# Patient Record
Sex: Female | Born: 1965 | Race: White | Hispanic: No | Marital: Married | Smoking: Current some day smoker
Health system: Southern US, Community
[De-identification: ages and names within clinical notes are randomized; demographics above are authoritative.]

## PROBLEM LIST (undated history)

## (undated) DIAGNOSIS — I219 Acute myocardial infarction, unspecified: Secondary | ICD-10-CM

## (undated) DIAGNOSIS — I10 Essential (primary) hypertension: Secondary | ICD-10-CM

## (undated) DIAGNOSIS — J449 Chronic obstructive pulmonary disease, unspecified: Secondary | ICD-10-CM

## (undated) DIAGNOSIS — I509 Heart failure, unspecified: Secondary | ICD-10-CM

## (undated) DIAGNOSIS — I82409 Acute embolism and thrombosis of unspecified deep veins of unspecified lower extremity: Secondary | ICD-10-CM

## (undated) HISTORY — PX: CARDIAC CATHETERIZATION: SHX172

---
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
## 2004-07-06 ENCOUNTER — Emergency Department: Payer: Self-pay | Admitting: Emergency Medicine

## 2008-08-27 ENCOUNTER — Emergency Department: Payer: Self-pay | Admitting: Emergency Medicine

## 2008-09-22 ENCOUNTER — Emergency Department: Payer: Self-pay | Admitting: Emergency Medicine

## 2008-10-01 DIAGNOSIS — I82409 Acute embolism and thrombosis of unspecified deep veins of unspecified lower extremity: Secondary | ICD-10-CM

## 2008-10-01 HISTORY — DX: Acute embolism and thrombosis of unspecified deep veins of unspecified lower extremity: I82.409

## 2008-12-14 ENCOUNTER — Inpatient Hospital Stay: Payer: Self-pay | Admitting: Internal Medicine

## 2010-10-24 DIAGNOSIS — F32A Depression, unspecified: Secondary | ICD-10-CM | POA: Insufficient documentation

## 2011-04-09 DIAGNOSIS — J449 Chronic obstructive pulmonary disease, unspecified: Secondary | ICD-10-CM | POA: Insufficient documentation

## 2011-04-09 DIAGNOSIS — I219 Acute myocardial infarction, unspecified: Secondary | ICD-10-CM | POA: Insufficient documentation

## 2011-04-09 DIAGNOSIS — R7301 Impaired fasting glucose: Secondary | ICD-10-CM | POA: Insufficient documentation

## 2011-04-09 DIAGNOSIS — I998 Other disorder of circulatory system: Secondary | ICD-10-CM | POA: Insufficient documentation

## 2011-04-09 DIAGNOSIS — I10 Essential (primary) hypertension: Secondary | ICD-10-CM | POA: Insufficient documentation

## 2011-04-09 DIAGNOSIS — I82419 Acute embolism and thrombosis of unspecified femoral vein: Secondary | ICD-10-CM | POA: Insufficient documentation

## 2011-04-09 DIAGNOSIS — E785 Hyperlipidemia, unspecified: Secondary | ICD-10-CM | POA: Insufficient documentation

## 2011-06-21 DIAGNOSIS — G4733 Obstructive sleep apnea (adult) (pediatric): Secondary | ICD-10-CM | POA: Insufficient documentation

## 2011-07-18 DIAGNOSIS — Z7901 Long term (current) use of anticoagulants: Secondary | ICD-10-CM | POA: Insufficient documentation

## 2011-09-04 ENCOUNTER — Inpatient Hospital Stay: Payer: Self-pay | Admitting: Internal Medicine

## 2011-09-19 DIAGNOSIS — N938 Other specified abnormal uterine and vaginal bleeding: Secondary | ICD-10-CM | POA: Insufficient documentation

## 2011-10-29 ENCOUNTER — Ambulatory Visit: Payer: Self-pay | Admitting: Internal Medicine

## 2012-01-11 DIAGNOSIS — G932 Benign intracranial hypertension: Secondary | ICD-10-CM | POA: Insufficient documentation

## 2012-03-10 DIAGNOSIS — M94261 Chondromalacia, right knee: Secondary | ICD-10-CM | POA: Insufficient documentation

## 2012-03-14 ENCOUNTER — Emergency Department: Payer: Self-pay | Admitting: Emergency Medicine

## 2012-03-20 DIAGNOSIS — M23203 Derangement of unspecified medial meniscus due to old tear or injury, right knee: Secondary | ICD-10-CM | POA: Insufficient documentation

## 2012-12-08 DIAGNOSIS — I10 Essential (primary) hypertension: Secondary | ICD-10-CM | POA: Insufficient documentation

## 2012-12-08 DIAGNOSIS — Z720 Tobacco use: Secondary | ICD-10-CM | POA: Insufficient documentation

## 2013-01-29 DIAGNOSIS — M1711 Unilateral primary osteoarthritis, right knee: Secondary | ICD-10-CM | POA: Insufficient documentation

## 2013-04-21 DIAGNOSIS — Z6841 Body Mass Index (BMI) 40.0 and over, adult: Secondary | ICD-10-CM | POA: Insufficient documentation

## 2013-07-06 DIAGNOSIS — E559 Vitamin D deficiency, unspecified: Secondary | ICD-10-CM | POA: Insufficient documentation

## 2013-07-11 DIAGNOSIS — N912 Amenorrhea, unspecified: Secondary | ICD-10-CM | POA: Insufficient documentation

## 2013-07-11 DIAGNOSIS — N6019 Diffuse cystic mastopathy of unspecified breast: Secondary | ICD-10-CM | POA: Insufficient documentation

## 2014-02-24 DIAGNOSIS — R2681 Unsteadiness on feet: Secondary | ICD-10-CM | POA: Insufficient documentation

## 2014-04-25 ENCOUNTER — Emergency Department: Payer: Self-pay | Admitting: Emergency Medicine

## 2014-05-21 DIAGNOSIS — G249 Dystonia, unspecified: Secondary | ICD-10-CM | POA: Insufficient documentation

## 2014-05-21 DIAGNOSIS — M5020 Other cervical disc displacement, unspecified cervical region: Secondary | ICD-10-CM | POA: Insufficient documentation

## 2014-12-31 DIAGNOSIS — M47816 Spondylosis without myelopathy or radiculopathy, lumbar region: Secondary | ICD-10-CM | POA: Insufficient documentation

## 2015-09-07 DIAGNOSIS — Z8742 Personal history of other diseases of the female genital tract: Secondary | ICD-10-CM | POA: Insufficient documentation

## 2016-04-18 DIAGNOSIS — M5126 Other intervertebral disc displacement, lumbar region: Secondary | ICD-10-CM | POA: Insufficient documentation

## 2016-04-18 DIAGNOSIS — M5432 Sciatica, left side: Secondary | ICD-10-CM | POA: Insufficient documentation

## 2016-11-28 DIAGNOSIS — M1710 Unilateral primary osteoarthritis, unspecified knee: Secondary | ICD-10-CM | POA: Insufficient documentation

## 2017-07-10 ENCOUNTER — Emergency Department
Admission: EM | Admit: 2017-07-10 | Discharge: 2017-07-10 | Disposition: A | Discharge status: Home / Self Care | Payer: 59 | Attending: Emergency Medicine | Admitting: Emergency Medicine

## 2017-07-10 ENCOUNTER — Emergency Department: Payer: 59

## 2017-07-10 DIAGNOSIS — R51 Headache: Secondary | ICD-10-CM | POA: Diagnosis present

## 2017-07-10 DIAGNOSIS — J449 Chronic obstructive pulmonary disease, unspecified: Secondary | ICD-10-CM | POA: Diagnosis not present

## 2017-07-10 DIAGNOSIS — I10 Essential (primary) hypertension: Secondary | ICD-10-CM | POA: Diagnosis not present

## 2017-07-10 DIAGNOSIS — R519 Headache, unspecified: Secondary | ICD-10-CM

## 2017-07-10 HISTORY — DX: Chronic obstructive pulmonary disease, unspecified: J44.9

## 2017-07-10 HISTORY — DX: Essential (primary) hypertension: I10

## 2017-07-10 MED ORDER — BUTALBITAL-APAP-CAFFEINE 50-325-40 MG PO TABS
2.0000 | ORAL_TABLET | Freq: Once | ORAL | Status: AC
  Administered 2017-07-10: 2 via ORAL
  Filled 2017-07-10: qty 2

## 2017-07-10 MED ORDER — BUTALBITAL-APAP-CAFFEINE 50-325-40 MG PO TABS: 1.0000 | ORAL_TABLET | Freq: Four times a day (QID) | ORAL | 0 refills | Status: DC | PRN

## 2017-07-10 NOTE — ED Notes (Signed)
Pt presents with right sided head pain. Pt states she slept all day yesterday and that she feels "like my brain is coming out." She points to the top left side of her head. She states that it hurts when she moves or when it is touched. Pt pulled away when this nurse attempted to palpate scalp. Pt takes coumadin; nad noted.

## 2017-07-10 NOTE — ED Triage Notes (Signed)
Pt states she has a knot on her head left side, just came up yesterday. Pt states she has not had any trauma to the head.

## 2017-07-10 NOTE — ED Notes (Signed)

## 2017-07-10 NOTE — ED Provider Notes (Signed)
[facility] Emergency Department Provider Note       Time seen: ----------------------------------------- 10:33 AM on 07/10/2017 -----------------------------------------     I have reviewed the triage vital signs and the nursing notes.   HISTORY   Chief Complaint Headache    HPI Mary Lutz is a 51 y.o. female with a history of COPD and hypertension who presents to the ED for headache and dizziness. Patient states she had not on the left side of her head that came up yesterday. She does not remember any trauma to the head. pain is 9 out of 10 in the left side of her head, nothing makes it better or worse. patient states she had some blurry vision in her left eye that is now resolved. She does not have a history of migraines.  Past Medical History:  Diagnosis Date  . COPD (chronic obstructive pulmonary disease) ([place])   . Hypertension     There are no active problems to display for this patient.   History reviewed. No pertinent surgical history.  Allergies Morphine and related; Toradol [ketorolac tromethamine]; and Influenza vaccines  Social History Social History  Substance Use Topics  . Smoking status: Never Smoker  . Smokeless tobacco: Never Used  . Alcohol use No    Review of Systems Constitutional: Negative for fever. Eyes: positive for transient blurry vision in the left eye ENT:  Negative for congestion, sore throat Cardiovascular: Negative for chest pain. Respiratory: Negative for shortness of breath. Gastrointestinal: Negative for abdominal pain, vomiting and diarrhea. Genitourinary: Negative for dysuria. Musculoskeletal: Negative for back pain. Skin: positive for feelings of swelling on the left scalp Neurological: positive for headache  All systems negative/normal/unremarkable except as stated in the HPI  ____________________________________________   PHYSICAL EXAM:  VITAL SIGNS: ED Triage Vitals  Enc Vitals Group      BP 07/10/17 0824 130/84     Pulse Rate 07/10/17 0822 79     Resp 07/10/17 0822 20     Temp 07/10/17 0822 (!) 97.5 F (36.4 C)     Temp src --      SpO2 07/10/17 0822 98 %     Weight 07/10/17 0823 (!) 320 lb (145.2 kg)     Height 07/10/17 0823 5\' 7"  (1.702 m)     Head Circumference --      Peak Flow --      Pain Score 07/10/17 0822 9     Pain Loc --      Pain Edu? --      Excl. in [place]? --     Constitutional: Alert and oriented. Well appearing and in no distress. Eyes: Conjunctivae are normal. Normal extraocular movements. ENT   Head: Normocephalic and atraumatic.I do not palpate any abnormal swelling or lesions on the scalp   Nose: No congestion/rhinnorhea.   Mouth/Throat: Mucous membranes are moist.   Neck: No stridor. Cardiovascular: Normal rate, regular rhythm. No murmurs, rubs, or gallops. Respiratory: Normal respiratory effort without tachypnea nor retractions. Breath sounds are clear and equal bilaterally. No wheezes/rales/rhonchi. Gastrointestinal: Soft and nontender. Normal bowel sounds Musculoskeletal: Nontender with normal range of motion in extremities. No lower extremity tenderness nor edema. Neurologic:  Normal speech and language. No gross focal neurologic deficits are appreciated. strength, sensation, cranial nerves are normal Skin:  left parietal scalp is tender to touch but there is no swelling Psychiatric: Mood and affect are normal. Speech and behavior are normal.  ____________________________________________  ED COURSE:  Pertinent labs & imaging results  that were available during my care of the patient were reviewed by me and considered in my medical decision making (see chart for details). Patient presents for headache, we will assess with labs and imaging as indicated.   Procedures ____________________________________________   RADIOLOGY Images were viewed by me  CT head is  unremarkable ____________________________________________  DIFFERENTIAL DIAGNOSIS   hematoma, migraine, tension headache, sebaceous cyst   FINAL ASSESSMENT AND PLAN  headache   Plan: Patient had presented for headache. Patients imaging was negative for any acute process intracranially or extracranially. I will prescribe Fioricet she can try for headaches as needed. She is stable for outpatient follow-up with her doctor.   Earleen Newport, MD   Note: This note was generated in part or whole with voice recognition software. Voice recognition is usually quite accurate but there are transcription errors that can and very often do occur. I apologize for any typographical errors that were not detected and corrected.     Earleen Newport, MD 07/10/17 [phone]

## 2018-02-18 ENCOUNTER — Emergency Department: Payer: 59

## 2018-02-18 ENCOUNTER — Other Ambulatory Visit: Payer: Self-pay

## 2018-02-18 ENCOUNTER — Encounter: Payer: Self-pay | Admitting: Emergency Medicine

## 2018-02-18 ENCOUNTER — Emergency Department
Admission: EM | Admit: 2018-02-18 | Discharge: 2018-02-18 | Disposition: A | Discharge status: Home / Self Care | Payer: 59 | Attending: Student in an Organized Health Care Education/Training Program | Admitting: Student in an Organized Health Care Education/Training Program

## 2018-02-18 DIAGNOSIS — Y92017 Garden or yard in single-family (private) house as the place of occurrence of the external cause: Secondary | ICD-10-CM | POA: Insufficient documentation

## 2018-02-18 DIAGNOSIS — Z7901 Long term (current) use of anticoagulants: Secondary | ICD-10-CM | POA: Diagnosis not present

## 2018-02-18 DIAGNOSIS — J449 Chronic obstructive pulmonary disease, unspecified: Secondary | ICD-10-CM | POA: Insufficient documentation

## 2018-02-18 DIAGNOSIS — I10 Essential (primary) hypertension: Secondary | ICD-10-CM | POA: Insufficient documentation

## 2018-02-18 DIAGNOSIS — Z862 Personal history of diseases of the blood and blood-forming organs and certain disorders involving the immune mechanism: Secondary | ICD-10-CM | POA: Diagnosis not present

## 2018-02-18 DIAGNOSIS — W010XXA Fall on same level from slipping, tripping and stumbling without subsequent striking against object, initial encounter: Secondary | ICD-10-CM | POA: Diagnosis not present

## 2018-02-18 DIAGNOSIS — S301XXA Contusion of abdominal wall, initial encounter: Secondary | ICD-10-CM | POA: Insufficient documentation

## 2018-02-18 DIAGNOSIS — M79605 Pain in left leg: Secondary | ICD-10-CM | POA: Insufficient documentation

## 2018-02-18 DIAGNOSIS — Z79899 Other long term (current) drug therapy: Secondary | ICD-10-CM | POA: Insufficient documentation

## 2018-02-18 DIAGNOSIS — Y999 Unspecified external cause status: Secondary | ICD-10-CM | POA: Diagnosis not present

## 2018-02-18 DIAGNOSIS — Y9301 Activity, walking, marching and hiking: Secondary | ICD-10-CM | POA: Insufficient documentation

## 2018-02-18 DIAGNOSIS — S0990XA Unspecified injury of head, initial encounter: Secondary | ICD-10-CM | POA: Diagnosis present

## 2018-02-18 DIAGNOSIS — W19XXXA Unspecified fall, initial encounter: Secondary | ICD-10-CM

## 2018-02-18 HISTORY — DX: Heart failure, unspecified: I50.9

## 2018-02-18 LAB — CBC WITH DIFFERENTIAL/PLATELET
BASOS PCT: 0 %
Basophils Absolute: 0.1 10*3/uL (ref 0–0.1)
Eosinophils Absolute: 0.3 10*3/uL (ref 0–0.7)
Eosinophils Relative: 2 %
HEMATOCRIT: 45.5 % (ref 35.0–47.0)
HEMOGLOBIN: 15.8 g/dL (ref 12.0–16.0)
Lymphocytes Relative: 27 %
Lymphs Abs: 3.4 10*3/uL (ref 1.0–3.6)
MCH: 31.7 pg (ref 26.0–34.0)
MCHC: 34.6 g/dL (ref 32.0–36.0)
MCV: 91.7 fL (ref 80.0–100.0)
MONOS PCT: 10 %
Monocytes Absolute: 1.3 10*3/uL — ABNORMAL HIGH (ref 0.2–0.9)
NEUTROS ABS: 7.6 10*3/uL — AB (ref 1.4–6.5)
NEUTROS PCT: 61 %
Platelets: 321 10*3/uL (ref 150–440)
RBC: 4.96 MIL/uL (ref 3.80–5.20)
RDW: 14.2 % (ref 11.5–14.5)
WBC: 12.7 10*3/uL — AB (ref 3.6–11.0)

## 2018-02-18 LAB — COMPREHENSIVE METABOLIC PANEL
ALBUMIN: 4.1 g/dL (ref 3.5–5.0)
ALK PHOS: 63 U/L (ref 38–126)
ALT: 15 U/L (ref 14–54)
AST: 23 U/L (ref 15–41)
Anion gap: 9 (ref 5–15)
BUN: 13 mg/dL (ref 6–20)
CALCIUM: 8.9 mg/dL (ref 8.9–10.3)
CHLORIDE: 107 mmol/L (ref 101–111)
CO2: 23 mmol/L (ref 22–32)
CREATININE: 0.55 mg/dL (ref 0.44–1.00)
GFR calc non Af Amer: >60 mL/min (ref >60)
GLUCOSE: 96 mg/dL (ref 65–99)
Potassium: 4.1 mmol/L (ref 3.5–5.1)
SODIUM: 139 mmol/L (ref 135–145)
Total Bilirubin: 0.8 mg/dL (ref 0.3–1.2)
Total Protein: 7 g/dL (ref 6.5–8.1)

## 2018-02-18 MED ORDER — FENTANYL CITRATE (PF) 100 MCG/2ML IJ SOLN
50.0000 ug | INTRAMUSCULAR | Status: DC | PRN
  Administered 2018-02-18: 50 ug via INTRAVENOUS
  Filled 2018-02-18: qty 2

## 2018-02-18 MED ORDER — HYDROCODONE-ACETAMINOPHEN 5-325 MG PO TABS
1.0000 | ORAL_TABLET | Freq: Once | ORAL | Status: AC
  Administered 2018-02-18: 1 via ORAL
  Filled 2018-02-18: qty 1

## 2018-02-18 MED ORDER — TRAMADOL HCL 50 MG PO TABS: 50.0000 mg | ORAL_TABLET | Freq: Four times a day (QID) | ORAL | 0 refills | Status: DC | PRN

## 2018-02-18 MED ORDER — IOPAMIDOL (ISOVUE-300) INJECTION 61%
100.0000 mL | Freq: Once | INTRAVENOUS | Status: AC | PRN
  Administered 2018-02-18: 100 mL via INTRAVENOUS

## 2018-02-18 MED ORDER — LIDOCAINE 5 % EX PTCH: 1.0000 | MEDICATED_PATCH | Freq: Two times a day (BID) | CUTANEOUS | 0 refills | Status: AC

## 2018-02-18 NOTE — Discharge Instructions (Signed)

## 2018-02-18 NOTE — ED Notes (Signed)
Patient transported to X-ray 

## 2018-02-18 NOTE — ED Notes (Signed)
Pt went to X-Ray.  

## 2018-02-18 NOTE — ED Triage Notes (Signed)
Pt in via [facility] from home, reports mechanical fall in yard.  Pt reports left hip, leg, knee pain, pt with decreased ROM due to pain, no shortening/rotation noted.  Pt received 140mcg Fentanyl IM via EMS prior to arrival.  NAD noted a this time.

## 2018-02-18 NOTE — ED Notes (Signed)
Pt returned from X Ray.

## 2018-02-18 NOTE — ED Notes (Signed)
Pt went to CT

## 2018-02-18 NOTE — ED Provider Notes (Signed)
[facility] Emergency Department Provider Note    First MD Initiated Contact with Patient 02/18/18 1846     (approximate)  I have reviewed the triage vital signs and the nursing notes.   HISTORY  Chief Complaint Fall    HPI Mary Lutz is a 52 y.o. female presents the ER after mechanical fall.  On the yard helping her father get around when she fell landing on her left hip and hitting her left knee and rolling into a small hole.  Reportedly did hit her head.  She is on Coumadin for history of clotting disorder.  Denies any LOC.  No numbness or tingling.  States the pain is mild to moderate in severity.  Pain is worsened with movement.  Denies any chest pain or shortness of breath.  Past Medical History:  Diagnosis Date  . COPD (chronic obstructive pulmonary disease) ([place])   . Hypertension    No family history on file. History reviewed. No pertinent surgical history. There are no active problems to display for this patient.     Prior to Admission medications   Medication Sig Start Date End Date Taking? Authorizing Provider  medroxyPROGESTERone (PROVERA) 10 MG tablet Take 10 mg by mouth daily.  02/05/18  Yes [provider]  warfarin (COUMADIN) 5 MG tablet Take 1 tablet (5MG ) by mouth Monday, Wednesday and Friday and 1 tablets (7.5MG ) by mouth Tuesday, Thursday, Saturday and Sunday 01/08/18  Yes [provider]    Allergies Influenza vaccines; Morphine and related; and Toradol [ketorolac tromethamine]    Social History Social History   Tobacco Use  . Smoking status: Never Smoker  . Smokeless tobacco: Never Used  Substance Use Topics  . Alcohol use: No  . Drug use: No    Review of Systems Patient denies headaches, rhinorrhea, blurry vision, numbness, shortness of breath, chest pain, edema, cough, abdominal pain, nausea, vomiting, diarrhea, dysuria, fevers, rashes or hallucinations unless otherwise stated above in  HPI. ____________________________________________   PHYSICAL EXAM:  VITAL SIGNS: Vitals:   02/18/18 1848  BP: (!) 169/102  Pulse: 87  Resp: 20  Temp: (!) 97.4 F (36.3 C)  SpO2: 98%    Constitutional: Alert and oriented.  in no acute distress. Eyes: Conjunctivae are normal.  Head: Atraumatic. Nose: No congestion/rhinnorhea. Mouth/Throat: Mucous membranes are moist.   Neck: No stridor. Painless ROM.  Cardiovascular: Normal rate, regular rhythm. Grossly normal heart sounds.  Good peripheral circulation. Respiratory: Normal respiratory effort.  No retractions. Lungs CTAB. Gastrointestinal: Soft and nontender. No distention. No abdominal bruits. No CVA tenderness. Genitourinary:  Musculoskeletal:pain with palpation of left hip, left log roll, and knee, no obvious contusion, ecchymosis, or deformity.  Brisk cap refill distally.  No joint effusions. Neurologic:  Normal speech and language. No gross focal neurologic deficits are appreciated. No facial droop Skin:  Skin is warm, dry and intact. No rash noted. Psychiatric: Mood and affect are normal. Speech and behavior are normal.  ____________________________________________   LABS (all labs ordered are listed, but only abnormal results are displayed)  Results for orders placed or performed during the hospital encounter of 02/18/18 (from the past 24 hour(s))  CBC with Differential/Platelet     Status: Abnormal   Collection Time: 02/18/18  7:27 PM  Result Value Ref Range   WBC 12.7 (H) 3.6 - 11.0 K/uL   RBC 4.96 3.80 - 5.20 MIL/uL   Hemoglobin 15.8 12.0 - 16.0 g/dL   HCT 45.5 35.0 - 47.0 %  MCV 91.7 80.0 - 100.0 fL   MCH 31.7 26.0 - 34.0 pg   MCHC 34.6 32.0 - 36.0 g/dL   RDW 14.2 11.5 - 14.5 %   Platelets 321 150 - 440 K/uL   Neutrophils Relative % 61 %   Neutro Abs 7.6 (H) 1.4 - 6.5 K/uL   Lymphocytes Relative 27 %   Lymphs Abs 3.4 1.0 - 3.6 K/uL   Monocytes Relative 10 %   Monocytes Absolute 1.3 (H) 0.2 - 0.9 K/uL    Eosinophils Relative 2 %   Eosinophils Absolute 0.3 0 - 0.7 K/uL   Basophils Relative 0 %   Basophils Absolute 0.1 0 - 0.1 K/uL   ____________________________________________  ____________________________________________  RADIOLOGY  I personally reviewed all radiographic images ordered to evaluate for the above acute complaints and reviewed radiology reports and findings.  These findings were personally discussed with the patient.  Please see medical record for radiology report.  ____________________________________________   PROCEDURES  Procedure(s) performed:  Procedures    Critical Care performed: no ____________________________________________   INITIAL IMPRESSION / ASSESSMENT AND PLAN / ED COURSE  Pertinent labs & imaging results that were available during my care of the patient were reviewed by me and considered in my medical decision making (see chart for details).  DDX: sah, sdh, edh, fracture, contusion, soft tissue injury, viscous injury, concussion, hemorrhage   Mary Lutz is a 52 y.o. who presents to the ED with injuries and pain as described above.  Radiographs ordered to evaluate for fracture show no dislocation but possible diastases of the pelvis and based on her pain will give IV pain medication and reassess.  Clinical Course as of Feb 19 2308  Tue Feb 18, 2018  2025 Patient reassessed and based on persistent pain will order CT imaging as I am concern for hematoma or occult fracture.   [PR]  2307 CT imaging shows no evidence of acute trauma or fracture.  Pelvic diastases noted on radiograph likely chronic.  Does likely have some abdominal wall contusion which would correlate the patient's pain.  Patient feels that the pain is improving and starting to move her legs.  This point I do believe patient stable and appropriate for discharge home.  Have discussed with the patient and available family all diagnostics and treatments performed thus far and all  questions were answered to the best of my ability. The patient demonstrates understanding and agreement with plan.    [PR]    Clinical Course User Index [PR] Merlyn Lot, MD     As part of my medical decision making, I reviewed the following data within the [place] notes reviewed and incorporated, Labs reviewed, notes from prior ED visits and [place] Controlled Substance Database   ____________________________________________   FINAL CLINICAL IMPRESSION(S) / ED DIAGNOSES  Final diagnoses:  Left leg pain  Fall, initial encounter  Contusion of abdominal wall, initial encounter      NEW MEDICATIONS STARTED DURING THIS VISIT:  New Prescriptions   No medications on file     Note:  This document was prepared using Dragon voice recognition software and may include unintentional dictation errors.    Merlyn Lot, MD 02/18/18 [phone]

## 2018-03-06 DIAGNOSIS — M1612 Unilateral primary osteoarthritis, left hip: Secondary | ICD-10-CM | POA: Insufficient documentation

## 2018-03-06 DIAGNOSIS — M47812 Spondylosis without myelopathy or radiculopathy, cervical region: Secondary | ICD-10-CM | POA: Insufficient documentation

## 2018-03-06 DIAGNOSIS — M1712 Unilateral primary osteoarthritis, left knee: Secondary | ICD-10-CM | POA: Insufficient documentation

## 2018-07-07 ENCOUNTER — Emergency Department: Payer: 59

## 2018-07-07 ENCOUNTER — Encounter: Payer: Self-pay | Admitting: Emergency Medicine

## 2018-07-07 ENCOUNTER — Other Ambulatory Visit: Payer: Self-pay

## 2018-07-07 ENCOUNTER — Emergency Department
Admission: EM | Admit: 2018-07-07 | Discharge: 2018-07-07 | Disposition: A | Discharge status: Home / Self Care | Payer: 59 | Attending: Student in an Organized Health Care Education/Training Program | Admitting: Student in an Organized Health Care Education/Training Program

## 2018-07-07 DIAGNOSIS — Y929 Unspecified place or not applicable: Secondary | ICD-10-CM | POA: Diagnosis not present

## 2018-07-07 DIAGNOSIS — Z7901 Long term (current) use of anticoagulants: Secondary | ICD-10-CM | POA: Diagnosis not present

## 2018-07-07 DIAGNOSIS — I509 Heart failure, unspecified: Secondary | ICD-10-CM | POA: Insufficient documentation

## 2018-07-07 DIAGNOSIS — Y999 Unspecified external cause status: Secondary | ICD-10-CM | POA: Diagnosis not present

## 2018-07-07 DIAGNOSIS — W01198A Fall on same level from slipping, tripping and stumbling with subsequent striking against other object, initial encounter: Secondary | ICD-10-CM | POA: Diagnosis not present

## 2018-07-07 DIAGNOSIS — M25561 Pain in right knee: Secondary | ICD-10-CM | POA: Insufficient documentation

## 2018-07-07 DIAGNOSIS — Z79899 Other long term (current) drug therapy: Secondary | ICD-10-CM | POA: Diagnosis not present

## 2018-07-07 DIAGNOSIS — S0990XA Unspecified injury of head, initial encounter: Secondary | ICD-10-CM | POA: Insufficient documentation

## 2018-07-07 DIAGNOSIS — Y9389 Activity, other specified: Secondary | ICD-10-CM | POA: Diagnosis not present

## 2018-07-07 DIAGNOSIS — I110 Hypertensive heart disease with heart failure: Secondary | ICD-10-CM | POA: Insufficient documentation

## 2018-07-07 LAB — BASIC METABOLIC PANEL
Anion gap: 11 (ref 5–15)
BUN: 12 mg/dL (ref 6–20)
CHLORIDE: 107 mmol/L (ref 98–111)
CO2: 22 mmol/L (ref 22–32)
CREATININE: 0.58 mg/dL (ref 0.44–1.00)
Calcium: 9 mg/dL (ref 8.9–10.3)
GFR calc Af Amer: >60 mL/min (ref >60)
GFR calc non Af Amer: >60 mL/min (ref >60)
GLUCOSE: 95 mg/dL (ref 70–99)
Potassium: 3.8 mmol/L (ref 3.5–5.1)
Sodium: 140 mmol/L (ref 135–145)

## 2018-07-07 LAB — CBC
HEMATOCRIT: 47.2 % — AB (ref 35.0–47.0)
Hemoglobin: 16.1 g/dL — ABNORMAL HIGH (ref 12.0–16.0)
MCH: 32.2 pg (ref 26.0–34.0)
MCHC: 34 g/dL (ref 32.0–36.0)
MCV: 94.7 fL (ref 80.0–100.0)
Platelets: 315 10*3/uL (ref 150–440)
RBC: 4.99 MIL/uL (ref 3.80–5.20)
RDW: 14.7 % — AB (ref 11.5–14.5)
WBC: 9.2 10*3/uL (ref 3.6–11.0)

## 2018-07-07 LAB — PROTIME-INR
INR: 2.02
Prothrombin Time: 22.7 seconds — ABNORMAL HIGH (ref 11.4–15.2)

## 2018-07-07 MED ORDER — TRAMADOL HCL 50 MG PO TABS: 50.0000 mg | ORAL_TABLET | Freq: Four times a day (QID) | ORAL | 0 refills | Status: DC | PRN

## 2018-07-07 MED ORDER — BUTALBITAL-APAP-CAFFEINE 50-325-40 MG PO TABS
2.0000 | ORAL_TABLET | Freq: Once | ORAL | Status: AC
  Administered 2018-07-07: 2 via ORAL
  Filled 2018-07-07: qty 2

## 2018-07-07 NOTE — ED Triage Notes (Signed)
Says she fell just prior to coming in after tripping on nail with her flip flops.  Says she is unsure about loc.  She has abrasions on right forehead and right cheek.  She is on coumadin.  Says she just feels tired.

## 2018-07-07 NOTE — ED Notes (Signed)
Hematoma noted above pt's right eye. Abrasion to face. Pt states R wrist and R knee pain.

## 2018-07-07 NOTE — ED Provider Notes (Signed)
[facility] Emergency Department Provider Note    First MD Initiated Contact with Patient 07/07/18 1305     (approximate)  I have reviewed the triage vital signs and the nursing notes.   HISTORY  Chief Complaint Fall and Head Injury    HPI Mary Lutz is a 52 y.o. female history of CHF and COPD on Coumadin for history of DVT PE presents the ER after mechanical fall hitting her right forehead right cheek right knee.  Describes pain is mild.  Presented to the ER as she is on Coumadin to be evaluated after head injury.  Denies any chest pain or shortness of breath.    Past Medical History:  Diagnosis Date  . CHF (congestive heart failure) ([place])   . COPD (chronic obstructive pulmonary disease) ([place])   . Hypertension    No family history on file. History reviewed. No pertinent surgical history. There are no active problems to display for this patient.     Prior to Admission medications   Medication Sig Start Date End Date Taking? Authorizing Provider  lidocaine (LIDODERM) 5 % Place 1 patch onto the skin every 12 (twelve) hours. Remove & Discard patch within 12 hours or as directed by MD 02/18/18 02/18/19  Merlyn Lot, MD  medroxyPROGESTERone (PROVERA) 10 MG tablet Take 10 mg by mouth daily.  02/05/18   [provider]  traMADol (ULTRAM) 50 MG tablet Take 1 tablet (50 mg total) by mouth every 6 (six) hours as needed. 07/07/18 07/07/19  Merlyn Lot, MD  warfarin (COUMADIN) 5 MG tablet Take 1 tablet (5MG ) by mouth Monday, Wednesday and Friday and 1 tablets (7.5MG ) by mouth Tuesday, Thursday, Saturday and Sunday 01/08/18   [provider]    Allergies Influenza vaccines; Morphine and related; Pneumococcal polysaccharide vaccine; and Toradol [ketorolac tromethamine]    Social History Social History   Tobacco Use  . Smoking status: Never Smoker  . Smokeless tobacco: Never Used  Substance Use Topics  . Alcohol use: No  .  Drug use: No    Review of Systems Patient denies headaches, rhinorrhea, blurry vision, numbness, shortness of breath, chest pain, edema, cough, abdominal pain, nausea, vomiting, diarrhea, dysuria, fevers, rashes or hallucinations unless otherwise stated above in HPI. ____________________________________________   PHYSICAL EXAM:  VITAL SIGNS: Vitals:   07/07/18 1309 07/07/18 1330  BP:  (!) 154/90  Pulse: 75 62  Resp: (!) 23 (!) 22  Temp:    SpO2: 97% 96%    Constitutional: Alert and oriented.  Eyes: Conjunctivae are normal.  Head: Contusion abrasion to right forehead and cheek.  No proptosis.  Dr. ocular motions are intact.  Midface is stable. Nose: No congestion/rhinnorhea. Mouth/Throat: Mucous membranes are moist.   Neck: No stridor. Painless ROM.  Cardiovascular: Normal rate, regular rhythm. Grossly normal heart sounds.  Good peripheral circulation. Respiratory: Normal respiratory effort.  No retractions. Lungs CTAB. Gastrointestinal: Soft and nontender. No distention. No abdominal bruits. No CVA tenderness. Genitourinary:  Musculoskeletal: Pain ecchymoses the right knee and small effusion.  No deformity.   No pain to BUE.   No joint effusions. Neurologic:  Normal speech and language. No gross focal neurologic deficits are appreciated. No facial droop Skin:  Skin is warm, dry and intact. No rash noted. Psychiatric: Mood and affect are normal. Speech and behavior are normal.  ____________________________________________   LABS (all labs ordered are listed, but only abnormal results are displayed)  Results for orders placed or performed during the hospital encounter  of 07/07/18 (from the past 24 hour(s))  Basic metabolic panel     Status: None   Collection Time: 07/07/18 11:22 AM  Result Value Ref Range   Sodium 140 135 - 145 mmol/L   Potassium 3.8 3.5 - 5.1 mmol/L   Chloride 107 98 - 111 mmol/L   CO2 22 22 - 32 mmol/L   Glucose, Bld 95 70 - 99 mg/dL   BUN 12 6 - 20  mg/dL   Creatinine, Ser 0.58 0.44 - 1.00 mg/dL   Calcium 9.0 8.9 - 10.3 mg/dL   GFR calc non Af Amer >60 >60 mL/min   GFR calc Af Amer >60 >60 mL/min   Anion gap 11 5 - 15  CBC     Status: Abnormal   Collection Time: 07/07/18 11:22 AM  Result Value Ref Range   WBC 9.2 3.6 - 11.0 K/uL   RBC 4.99 3.80 - 5.20 MIL/uL   Hemoglobin 16.1 (H) 12.0 - 16.0 g/dL   HCT 47.2 (H) 35.0 - 47.0 %   MCV 94.7 80.0 - 100.0 fL   MCH 32.2 26.0 - 34.0 pg   MCHC 34.0 32.0 - 36.0 g/dL   RDW 14.7 (H) 11.5 - 14.5 %   Platelets 315 150 - 440 K/uL  Protime-INR     Status: Abnormal   Collection Time: 07/07/18 11:22 AM  Result Value Ref Range   Prothrombin Time 22.7 (H) 11.4 - 15.2 seconds   INR 2.02    ____________________________________________ ____________________________________________  RADIOLOGY  I personally reviewed all radiographic images ordered to evaluate for the above acute complaints and reviewed radiology reports and findings.  These findings were personally discussed with the patient.  Please see medical record for radiology report.  ____________________________________________   PROCEDURES  Procedure(s) performed:  Procedures    Critical Care performed: no ____________________________________________   INITIAL IMPRESSION / ASSESSMENT AND PLAN / ED COURSE  Pertinent labs & imaging results that were available during my care of the patient were reviewed by me and considered in my medical decision making (see chart for details).   DDX: sdh, sah, iph, fracture, contusion,   Mary Lutz is a 52 y.o. who presents to the ED with symptoms as described above.  Blood work is reassuring.  CT imaging ordered for the above differential shows no evidence of acute intracranial abnormality.  Knee film shows no evidence of fracture.  Will provide pain medication.  Clinical Course as of Jul 07 1540  Mon Jul 07, 2018  1446 Discussed results with patient.  We will plan ambulation trial.    [PR]  1540 Patient with pain of the right knee with ambulation therefore will place a knee immobilizer give crutches and have patient follow-up with orthopedics.   [PR]    Clinical Course User Index [PR] Merlyn Lot, MD     As part of my medical decision making, I reviewed the following data within the [place] notes reviewed and incorporated, Labs reviewed, notes from prior ED visits and [place] Controlled Substance Database   ____________________________________________   FINAL CLINICAL IMPRESSION(S) / ED DIAGNOSES  Final diagnoses:  Minor head injury, initial encounter  Acute pain of right knee      NEW MEDICATIONS STARTED DURING THIS VISIT:  Current Discharge Medication List       Note:  This document was prepared using Dragon voice recognition software and may include unintentional dictation errors.    Merlyn Lot, MD 07/07/18 [phone]

## 2018-11-12 ENCOUNTER — Emergency Department: Payer: 59

## 2018-11-12 ENCOUNTER — Emergency Department
Admission: EM | Admit: 2018-11-12 | Discharge: 2018-11-12 | Disposition: A | Discharge status: Home / Self Care | Payer: 59 | Attending: Emergency Medicine | Admitting: Emergency Medicine

## 2018-11-12 ENCOUNTER — Other Ambulatory Visit: Payer: Self-pay

## 2018-11-12 ENCOUNTER — Encounter: Payer: Self-pay | Admitting: Emergency Medicine

## 2018-11-12 DIAGNOSIS — R51 Headache: Secondary | ICD-10-CM | POA: Diagnosis present

## 2018-11-12 DIAGNOSIS — I110 Hypertensive heart disease with heart failure: Secondary | ICD-10-CM | POA: Insufficient documentation

## 2018-11-12 DIAGNOSIS — Z79899 Other long term (current) drug therapy: Secondary | ICD-10-CM | POA: Insufficient documentation

## 2018-11-12 DIAGNOSIS — I509 Heart failure, unspecified: Secondary | ICD-10-CM | POA: Insufficient documentation

## 2018-11-12 DIAGNOSIS — Z7901 Long term (current) use of anticoagulants: Secondary | ICD-10-CM | POA: Insufficient documentation

## 2018-11-12 DIAGNOSIS — J449 Chronic obstructive pulmonary disease, unspecified: Secondary | ICD-10-CM | POA: Diagnosis not present

## 2018-11-12 DIAGNOSIS — R519 Headache, unspecified: Secondary | ICD-10-CM

## 2018-11-12 LAB — CBC WITH DIFFERENTIAL/PLATELET
Abs Immature Granulocytes: 0.02 10*3/uL (ref 0.00–0.07)
Basophils Absolute: 0.1 10*3/uL (ref 0.0–0.1)
Basophils Relative: 1 %
EOS ABS: 0.2 10*3/uL (ref 0.0–0.5)
Eosinophils Relative: 2 %
HEMATOCRIT: 47.1 % — AB (ref 36.0–46.0)
Hemoglobin: 15.4 g/dL — ABNORMAL HIGH (ref 12.0–15.0)
Immature Granulocytes: 0 %
Lymphocytes Relative: 27 %
Lymphs Abs: 3 10*3/uL (ref 0.7–4.0)
MCH: 31.4 pg (ref 26.0–34.0)
MCHC: 32.7 g/dL (ref 30.0–36.0)
MCV: 95.9 fL (ref 80.0–100.0)
Monocytes Absolute: 1.1 10*3/uL — ABNORMAL HIGH (ref 0.1–1.0)
Monocytes Relative: 10 %
NEUTROS PCT: 60 %
Neutro Abs: 6.6 10*3/uL (ref 1.7–7.7)
Platelets: 323 10*3/uL (ref 150–400)
RBC: 4.91 MIL/uL (ref 3.87–5.11)
RDW: 13.3 % (ref 11.5–15.5)
WBC: 10.9 10*3/uL — ABNORMAL HIGH (ref 4.0–10.5)
nRBC: 0 % (ref 0.0–0.2)

## 2018-11-12 LAB — COMPREHENSIVE METABOLIC PANEL
ALBUMIN: 4.2 g/dL (ref 3.5–5.0)
ALT: 15 U/L (ref 0–44)
ANION GAP: 7 (ref 5–15)
AST: 18 U/L (ref 15–41)
Alkaline Phosphatase: 59 U/L (ref 38–126)
BUN: 12 mg/dL (ref 6–20)
CO2: 24 mmol/L (ref 22–32)
Calcium: 9.2 mg/dL (ref 8.9–10.3)
Chloride: 108 mmol/L (ref 98–111)
Creatinine, Ser: 0.49 mg/dL (ref 0.44–1.00)
GFR calc non Af Amer: >60 mL/min (ref >60)
GLUCOSE: 83 mg/dL (ref 70–99)
Potassium: 4.1 mmol/L (ref 3.5–5.1)
SODIUM: 139 mmol/L (ref 135–145)
Total Bilirubin: 0.7 mg/dL (ref 0.3–1.2)
Total Protein: 7.6 g/dL (ref 6.5–8.1)

## 2018-11-12 LAB — PROTIME-INR
INR: 2.36
Prothrombin Time: 25.5 seconds — ABNORMAL HIGH (ref 11.4–15.2)

## 2018-11-12 MED ORDER — ACETAZOLAMIDE ER 500 MG PO CP12: 500.0000 mg | ORAL_CAPSULE | Freq: Two times a day (BID) | ORAL | 0 refills | Status: DC

## 2018-11-12 MED ORDER — LORAZEPAM 2 MG/ML IJ SOLN
1.0000 mg | Freq: Once | INTRAMUSCULAR | Status: AC
  Administered 2018-11-12: 1 mg via INTRAVENOUS
  Filled 2018-11-12: qty 1

## 2018-11-12 MED ORDER — ONDANSETRON HCL 4 MG/2ML IJ SOLN
4.0000 mg | Freq: Once | INTRAMUSCULAR | Status: AC
  Administered 2018-11-12: 4 mg via INTRAVENOUS
  Filled 2018-11-12: qty 2

## 2018-11-12 MED ORDER — OXYCODONE-ACETAMINOPHEN 5-325 MG PO TABS: 1.0000 | ORAL_TABLET | ORAL | 0 refills | Status: DC | PRN

## 2018-11-12 MED ORDER — OXYCODONE-ACETAMINOPHEN 5-325 MG PO TABS: 1.0000 | ORAL_TABLET | Freq: Four times a day (QID) | ORAL | 0 refills | Status: DC | PRN

## 2018-11-12 MED ORDER — HYDROMORPHONE HCL 1 MG/ML IJ SOLN
0.5000 mg | Freq: Once | INTRAMUSCULAR | Status: AC
  Administered 2018-11-12: 0.5 mg via INTRAVENOUS
  Filled 2018-11-12: qty 1

## 2018-11-12 MED ORDER — IOHEXOL 350 MG/ML SOLN
75.0000 mL | Freq: Once | INTRAVENOUS | Status: AC | PRN
  Administered 2018-11-12: 75 mL via INTRAVENOUS

## 2018-11-12 MED ORDER — MECLIZINE HCL 25 MG PO TABS: 25.0000 mg | ORAL_TABLET | Freq: Three times a day (TID) | ORAL | 0 refills | Status: DC | PRN

## 2018-11-12 MED ORDER — FENTANYL CITRATE (PF) 100 MCG/2ML IJ SOLN
12.5000 ug | Freq: Once | INTRAMUSCULAR | Status: AC
  Administered 2018-11-12: 12.5 ug via INTRAVENOUS
  Filled 2018-11-12: qty 2

## 2018-11-12 NOTE — ED Provider Notes (Signed)
This patient was signed out to me by Dr. Corinna Capra.  53 year old female presenting for a headache.  She underwent evaluation for CVA with CT of the head which did not show any acute intracranial process and CT angiogram of the head and neck which showed normal vessels.  I have followed up her MRI, which does not show any signs of stroke and on my examination, the patient has no focal neurologic deficit.  There is some fluid around the optic nerve which may indicate signs of idiopathic intracranial hypertension.  I have reviewed this with the patient, who states she has been told this before.  She has been placed on Diamox in the past, but self discontinued this medication.  She does not currently follow with a neurologist.  At this time, the patient has no evidence of an acute neurologic emergency that requires admission or intervention.  We will plan to discharge the patient with Diamox, as well as Percocet.  She cannot take NSAID medications given her Coumadin use.  She has been instructed to follow-up with her PMD tomorrow, as previously scheduled, and to make an appointment with a neurologist.  Follow-up instructions as well as return precautions were discussed   Eula Listen, MD 11/12/18 2313

## 2018-11-12 NOTE — ED Provider Notes (Signed)
[facility] Emergency Department Provider Note   ____________________________________________   First MD Initiated Contact with Patient 11/12/18 1606     (approximate)  I have reviewed the triage vital signs and the nursing notes.   HISTORY  Chief Complaint Headache   HPI Kalisi L Molino is a 53 y.o. female who reports she had a headache yesterday she thought was involved with her ear infection she says she has been fighting and then she went to bed woke up this morning headache was still there later on in the day around about 27 is understood that she had sudden worsening of the headache with pain in the neck and the back of the neck back and vertex of the head with vertigo will come on at once.  The vertigo is worse with head movement.  Everything spins.  The headache is deep and makes her feel like the back of her head to blow off.   Past Medical History:  Diagnosis Date  . CHF (congestive heart failure) ([place])   . COPD (chronic obstructive pulmonary disease) ([place])   . Hypertension     There are no active problems to display for this patient.   History reviewed. No pertinent surgical history.  Prior to Admission medications   Medication Sig Start Date End Date Taking? Authorizing Provider  lidocaine (LIDODERM) 5 % Place 1 patch onto the skin every 12 (twelve) hours. Remove & Discard patch within 12 hours or as directed by MD 02/18/18 02/18/19  Merlyn Lot, MD  medroxyPROGESTERone (PROVERA) 10 MG tablet Take 10 mg by mouth daily.  02/05/18   [provider]  oxyCODONE-acetaminophen (PERCOCET) 5-325 MG tablet Take 1 tablet by mouth every 4 (four) hours as needed for severe pain. 11/12/18 11/12/19  Nena Polio, MD  traMADol (ULTRAM) 50 MG tablet Take 1 tablet (50 mg total) by mouth every 6 (six) hours as needed. 07/07/18 07/07/19  Merlyn Lot, MD  warfarin (COUMADIN) 5 MG tablet Take 1 tablet (5MG ) by mouth Monday, Wednesday and Friday  and 1 tablets (7.5MG ) by mouth Tuesday, Thursday, Saturday and Sunday 01/08/18   [provider]    Allergies Influenza vaccines; Morphine and related; Pneumococcal polysaccharide vaccine; and Toradol [ketorolac tromethamine]  No family history on file.  Social History Social History   Tobacco Use  . Smoking status: Never Smoker  . Smokeless tobacco: Never Used  Substance Use Topics  . Alcohol use: No  . Drug use: No    Review of Systems  Constitutional: No fever/chills Eyes: No visual changes. ENT: No sore throat. Cardiovascular: Denies chest pain. Respiratory: Denies shortness of breath. Gastrointestinal: No abdominal pain.  No nausea, no vomiting.  No diarrhea.  No constipation. Genitourinary: Negative for dysuria. Musculoskeletal: Negative for back pain. Skin: Negative for rash. Neurological: Negative for focal weakness   ____________________________________________   PHYSICAL EXAM:  VITAL SIGNS: ED Triage Vitals  Enc Vitals Group     BP 11/12/18 1410 (!) 125/94     Pulse Rate 11/12/18 1410 87     Resp 11/12/18 1410 18     Temp 11/12/18 1410 98.4 F (36.9 C)     Temp Source 11/12/18 1410 Oral     SpO2 11/12/18 1410 97 %     Weight 11/12/18 1411 260 lb (117.9 kg)     Height 11/12/18 1411 5\' 7"  (1.702 m)     Head Circumference --      Peak Flow --      Pain  Score 11/12/18 1411 10     Pain Loc --      Pain Edu? --      Excl. in [place]? --    Constitutional: Alert and oriented. Well appearing and in no acute distress. Eyes: Conjunctivae are normal. PERRL. EOMI. fundi appear to be intact Head: Atraumatic. Nose: No congestion/rhinnorhea. Mouth/Throat: Mucous membranes are moist.  Oropharynx non-erythematous. Neck: No stridor.   Cardiovascular: Normal rate, regular rhythm. Grossly normal heart sounds.  Good peripheral circulation. Respiratory: Normal respiratory effort.  No retractions. Lungs CTAB. Gastrointestinal: Soft and nontender. No distention.  No abdominal bruits. No CVA tenderness. Musculoskeletal: No lower extremity tenderness nor edema.  . Neurologic:  Normal speech and language. No gross focal neurologic deficits are appreciated.  Specifically cranial nerves II through XII are intact of the visual fields were not checked finger-to-nose rapid alternating movements and hands are normal motor strength is 5/5 throughout patient does not report any numbness I do not see a great deal of nystagmus present with head movement.  Patient does complain of a lot of dizziness though when I lay her back and move her head from one side to the other.  Thrust testing is i suspicious for intracranial pathology as I do not see any deviation. Skin:  Skin is warm, dry and intact. No rash noted. Psychiatric: Mood and affect are normal. Speech and behavior are normal.  ____________________________________________   LABS (all labs ordered are listed, but only abnormal results are displayed)  Labs Reviewed  PROTIME-INR - Abnormal; Notable for the following components:      Result Value   Prothrombin Time 25.5 (*)    All other components within normal limits  CBC WITH DIFFERENTIAL/PLATELET - Abnormal; Notable for the following components:   WBC 10.9 (*)    Hemoglobin 15.4 (*)    HCT 47.1 (*)    Monocytes Absolute 1.1 (*)    All other components within normal limits  COMPREHENSIVE METABOLIC PANEL   ____________________________________________  EKG   ____________________________________________  RADIOLOGY  ED MD interpretation: Initial CT of the head is negative.  I reviewed the films  Official radiology report(s): Ct Angio Head W Or Wo Contrast  Result Date: 11/12/2018 CLINICAL DATA:  Initial evaluation for acute severe headache. EXAM: CT ANGIOGRAPHY HEAD AND NECK TECHNIQUE: Multidetector CT imaging of the head and neck was performed using the standard protocol during bolus administration of intravenous contrast. Multiplanar CT image  reconstructions and MIPs were obtained to evaluate the vascular anatomy. Carotid stenosis measurements (when applicable) are obtained utilizing NASCET criteria, using the distal internal carotid diameter as the denominator. CONTRAST:  9mL OMNIPAQUE IOHEXOL 350 MG/ML SOLN COMPARISON:  Prior head CT from earlier the same day. FINDINGS: CTA NECK FINDINGS Aortic arch: Visualized aortic arch of normal caliber with normal 3 vessel morphology. Mild atheromatous plaque about the arch and origin of the great vessels without hemodynamically significant stenosis. Visualized subclavian arteries widely patent. Right carotid system: Right common carotid artery patent from its origin to the bifurcation without stenosis. Mild atheromatous change about the right bifurcation without significant stenosis. Multifocal irregularity involving the mid and distal right ICA, most consistent with changes related FMD. No significant stenosis or associated aneurysm. No dissection. Left carotid system: Left common carotid artery patent from its origin to the bifurcation without stenosis. Mild atheromatous change about the left bifurcation without significant narrowing. Similar but more mild changes involving the mid-distal left ICA suggestive of FMD. Again, no significant stenosis or associated aneurysm. No dissection.  Vertebral arteries: Vertebral arteries patent within the neck without stenosis, dissection, or occlusion. Skeleton: No acute osseous finding. No worrisome osseous lesions. Mild-to-moderate multilevel cervical spondylolysis, most notable at C6-7. Other neck: No other acute soft tissue abnormality within the neck. Upper chest: Small layering right pleural effusion partially visualized. Visualized upper chest demonstrates no other acute finding. Review of the MIP images confirms the above findings CTA HEAD FINDINGS Anterior circulation: Petrous segments widely patent bilaterally. Mild scattered atheromatous plaque within the carotid  siphons without significant stenosis. ICA termini well perfused. A1 segments patent bilaterally. Normal anterior communicating artery. Anterior cerebral arteries widely patent to their distal aspects. No M1 stenosis or occlusion. Normal MCA bifurcations. Distal MCA branches well perfused and symmetric. Posterior circulation: Vertebral arteries widely patent to the vertebrobasilar junction without stenosis. Left vertebral artery slightly dominant. Patent right PICA. Left PICA not visualized. Basilar widely patent to its distal aspect without stenosis. Superior cerebellar arteries patent bilaterally. Right PCA supplied via a hypoplastic right P1 segment as well as a small right posterior communicating artery. Left PCA supplied mainly via the basilar. Right PCA diffusely hypoplastic. PCAs well perfused to their distal aspects without stenosis. Venous sinuses: Patent. Anatomic variants: None significant. Delayed phase: No abnormal enhancement. Review of the MIP images confirms the above findings IMPRESSION: 1. Negative CTA for acute vascular abnormality. No large vessel occlusion, hemodynamically significant stenosis, or dissection. No intracranial aneurysm. 2. 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 3. Mild atherosclerotic change about the carotid bifurcations and carotid siphons for age. Electronically Signed   By: Jeannine Boga M.D.   On: 11/12/2018 18:41   Ct Head Wo Contrast  Result Date: 11/12/2018 CLINICAL DATA:  Headache, vertigo.  History of hypertension. EXAM: CT HEAD WITHOUT CONTRAST TECHNIQUE: Contiguous axial images were obtained from the base of the skull through the vertex without intravenous contrast. COMPARISON:  CT HEAD July 07, 2018 FINDINGS: BRAIN: No intraparenchymal hemorrhage, mass effect nor midline shift. The ventricles and sulci are normal. No acute large vascular territory infarcts. No abnormal  extra-axial fluid collections. Basal cisterns are patent. VASCULAR: Mild calcific atherosclerosis. SKULL/SOFT TISSUES: No skull fracture. Partially empty sella. Mild temporomandibular osteoarthrosis. No significant soft tissue swelling. ORBITS/SINUSES: The included ocular globes and orbital contents are normal.Trace paranasal sinus mucosal thickening. Mastoid air cells are well aerated. OTHER: None. IMPRESSION: 1. No acute intracranial process. 2. Mild atherosclerosis, advanced for age. Electronically Signed   By: Elon Alas M.D.   On: 11/12/2018 14:41   Ct Angio Neck W And/or Wo Contrast  Result Date: 11/12/2018 CLINICAL DATA:  Initial evaluation for acute severe headache. EXAM: CT ANGIOGRAPHY HEAD AND NECK TECHNIQUE: Multidetector CT imaging of the head and neck was performed using the standard protocol during bolus administration of intravenous contrast. Multiplanar CT image reconstructions and MIPs were obtained to evaluate the vascular anatomy. Carotid stenosis measurements (when applicable) are obtained utilizing NASCET criteria, using the distal internal carotid diameter as the denominator. CONTRAST:  12mL OMNIPAQUE IOHEXOL 350 MG/ML SOLN COMPARISON:  Prior head CT from earlier the same day. FINDINGS: CTA NECK FINDINGS Aortic arch: Visualized aortic arch of normal caliber with normal 3 vessel morphology. Mild atheromatous plaque about the arch and origin of the great vessels without hemodynamically significant stenosis. Visualized subclavian arteries widely patent. Right carotid system: Right common carotid artery patent from its origin to the bifurcation without stenosis. Mild atheromatous change about the right bifurcation without significant stenosis. Multifocal  irregularity involving the mid and distal right ICA, most consistent with changes related FMD. No significant stenosis or associated aneurysm. No dissection. Left carotid system: Left common carotid artery patent from its origin to the  bifurcation without stenosis. Mild atheromatous change about the left bifurcation without significant narrowing. Similar but more mild changes involving the mid-distal left ICA suggestive of FMD. Again, no significant stenosis or associated aneurysm. No dissection. Vertebral arteries: Vertebral arteries patent within the neck without stenosis, dissection, or occlusion. Skeleton: No acute osseous finding. No worrisome osseous lesions. Mild-to-moderate multilevel cervical spondylolysis, most notable at C6-7. Other neck: No other acute soft tissue abnormality within the neck. Upper chest: Small layering right pleural effusion partially visualized. Visualized upper chest demonstrates no other acute finding. Review of the MIP images confirms the above findings CTA HEAD FINDINGS Anterior circulation: Petrous segments widely patent bilaterally. Mild scattered atheromatous plaque within the carotid siphons without significant stenosis. ICA termini well perfused. A1 segments patent bilaterally. Normal anterior communicating artery. Anterior cerebral arteries widely patent to their distal aspects. No M1 stenosis or occlusion. Normal MCA bifurcations. Distal MCA branches well perfused and symmetric. Posterior circulation: Vertebral arteries widely patent to the vertebrobasilar junction without stenosis. Left vertebral artery slightly dominant. Patent right PICA. Left PICA not visualized. Basilar widely patent to its distal aspect without stenosis. Superior cerebellar arteries patent bilaterally. Right PCA supplied via a hypoplastic right P1 segment as well as a small right posterior communicating artery. Left PCA supplied mainly via the basilar. Right PCA diffusely hypoplastic. PCAs well perfused to their distal aspects without stenosis. Venous sinuses: Patent. Anatomic variants: None significant. Delayed phase: No abnormal enhancement. Review of the MIP images confirms the above findings IMPRESSION: 1. Negative CTA for acute  vascular abnormality. No large vessel occlusion, hemodynamically significant stenosis, or dissection. No intracranial aneurysm. 2. 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 3. Mild atherosclerotic change about the carotid bifurcations and carotid siphons for age. Electronically Signed   By: Jeannine Boga M.D.   On: 11/12/2018 18:41    ____________________________________________   PROCEDURES  Procedure(s) performed:  Procedures  Critical Care performed:   ____________________________________________   INITIAL IMPRESSION / ASSESSMENT AND PLAN / ED COURSE Patient had sudden onset of severe neck ache and back of a headache.  This could be due to a dissection.  We will get a CT angiogram of the head and neck to check for dissection of the neck.  If this is negative I anticipate we will get an MRI of the head.  This will help me determine if there is any kind of stroke going on that did not show up on the CT.  Patient says she says morphine makes her get short of breath but she can take Percocets.  I will try some fentanyl and very small amount and see if she can tolerate that.   ----------------------------------------- 8:37 PM on 11/12/2018 -----------------------------------------  CT Angie of the head and neck are normal.  She still has the vertigo.  I will try the MRI to see what that looks like.  If she is okay with that I will give her some Percocet to go home with.  Also give her some Antivert for the dizziness.      ____________________________________________   FINAL CLINICAL IMPRESSION(S) / ED DIAGNOSES  Final diagnoses:  Acute nonintractable headache, unspecified headache type     ED Discharge Orders  Ordered    oxyCODONE-acetaminophen (PERCOCET) 5-325 MG tablet  Every 4 hours PRN     11/12/18 2036           Note:  This document was prepared using Dragon voice  recognition software and may include unintentional dictation errors.    Nena Polio, MD 11/12/18 2038

## 2018-11-12 NOTE — Discharge Instructions (Addendum)
Please return for worse headache.  Also return for fever vomiting or feeling sicker.  Please follow-up with your regular doctor in the next few days.  You can take the Percocet 1 pill 4 times a day as needed for day or so.  Use the Antivert 1 pill 3 times a day as needed for dizziness.  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  Please read the insert about this, and start Diamox.  You may continue to take pain medication for your headache as well.  Do not drive within 8 hours of taking Percocet.  Make an appointment with Dr. Melrose Nakayama, the neurologist, for further evaluation.

## 2018-11-12 NOTE — ED Triage Notes (Signed)
PT arrives with complaints of headache that woke her from her sleep this morning. Pt states she feels like everything is spinning around her. In triage pt a & o x 4, speech clear, no weakness or neuro deficits noted in triage.

## 2018-11-12 NOTE — ED Notes (Signed)
Patient completed MRI screen with MRI tech.

## 2018-11-12 NOTE — ED Notes (Signed)
Patient back from MRI.

## 2018-12-22 ENCOUNTER — Emergency Department: Payer: 59

## 2018-12-22 ENCOUNTER — Other Ambulatory Visit: Payer: Self-pay

## 2018-12-22 ENCOUNTER — Emergency Department
Admission: EM | Admit: 2018-12-22 | Discharge: 2018-12-22 | Disposition: A | Discharge status: Home / Self Care | Payer: 59 | Attending: Emergency Medicine | Admitting: Emergency Medicine

## 2018-12-22 DIAGNOSIS — I110 Hypertensive heart disease with heart failure: Secondary | ICD-10-CM | POA: Diagnosis not present

## 2018-12-22 DIAGNOSIS — F17200 Nicotine dependence, unspecified, uncomplicated: Secondary | ICD-10-CM | POA: Diagnosis not present

## 2018-12-22 DIAGNOSIS — J069 Acute upper respiratory infection, unspecified: Secondary | ICD-10-CM | POA: Diagnosis not present

## 2018-12-22 DIAGNOSIS — R0602 Shortness of breath: Secondary | ICD-10-CM | POA: Diagnosis present

## 2018-12-22 DIAGNOSIS — I509 Heart failure, unspecified: Secondary | ICD-10-CM | POA: Diagnosis not present

## 2018-12-22 DIAGNOSIS — B9789 Other viral agents as the cause of diseases classified elsewhere: Secondary | ICD-10-CM

## 2018-12-22 DIAGNOSIS — Z79899 Other long term (current) drug therapy: Secondary | ICD-10-CM | POA: Diagnosis not present

## 2018-12-22 DIAGNOSIS — J441 Chronic obstructive pulmonary disease with (acute) exacerbation: Secondary | ICD-10-CM | POA: Diagnosis not present

## 2018-12-22 DIAGNOSIS — Z7901 Long term (current) use of anticoagulants: Secondary | ICD-10-CM | POA: Insufficient documentation

## 2018-12-22 LAB — BASIC METABOLIC PANEL
Anion gap: 9 (ref 5–15)
BUN: 16 mg/dL (ref 6–20)
CO2: 22 mmol/L (ref 22–32)
Calcium: 8.6 mg/dL — ABNORMAL LOW (ref 8.9–10.3)
Chloride: 107 mmol/L (ref 98–111)
Creatinine, Ser: 0.63 mg/dL (ref 0.44–1.00)
GFR calc Af Amer: >60 mL/min (ref >60)
GFR calc non Af Amer: >60 mL/min (ref >60)
Glucose, Bld: 136 mg/dL — ABNORMAL HIGH (ref 70–99)
Potassium: 4.1 mmol/L (ref 3.5–5.1)
Sodium: 138 mmol/L (ref 135–145)

## 2018-12-22 LAB — PROTIME-INR
INR: 2.2 — ABNORMAL HIGH (ref 0.8–1.2)
Prothrombin Time: 23.8 seconds — ABNORMAL HIGH (ref 11.4–15.2)

## 2018-12-22 LAB — CBC
HCT: 45.3 % (ref 36.0–46.0)
Hemoglobin: 15.1 g/dL — ABNORMAL HIGH (ref 12.0–15.0)
MCH: 31.9 pg (ref 26.0–34.0)
MCHC: 33.3 g/dL (ref 30.0–36.0)
MCV: 95.6 fL (ref 80.0–100.0)
Platelets: 385 10*3/uL (ref 150–400)
RBC: 4.74 MIL/uL (ref 3.87–5.11)
RDW: 13.3 % (ref 11.5–15.5)
WBC: 15.4 10*3/uL — ABNORMAL HIGH (ref 4.0–10.5)
nRBC: 0 % (ref 0.0–0.2)

## 2018-12-22 LAB — INFLUENZA PANEL BY PCR (TYPE A & B)
Influenza A By PCR: NEGATIVE
Influenza B By PCR: NEGATIVE

## 2018-12-22 MED ORDER — ALBUTEROL SULFATE HFA 108 (90 BASE) MCG/ACT IN AERS
1.0000 | INHALATION_SPRAY | Freq: Once | RESPIRATORY_TRACT | Status: AC
  Administered 2018-12-22: 2 via RESPIRATORY_TRACT
  Filled 2018-12-22: qty 6.7

## 2018-12-22 MED ORDER — SODIUM CHLORIDE 0.9 % IV BOLUS: 500.0000 mL | Freq: Once | INTRAVENOUS | Status: DC

## 2018-12-22 MED ORDER — IPRATROPIUM-ALBUTEROL 0.5-2.5 (3) MG/3ML IN SOLN: 3.0000 mL | Freq: Once | RESPIRATORY_TRACT | Status: DC

## 2018-12-22 MED ORDER — METHYLPREDNISOLONE 4 MG PO TBPK: ORAL_TABLET | ORAL | 0 refills | Status: DC

## 2018-12-22 MED ORDER — ALBUTEROL SULFATE HFA 108 (90 BASE) MCG/ACT IN AERS: 2.0000 | INHALATION_SPRAY | Freq: Once | RESPIRATORY_TRACT | Status: DC

## 2018-12-22 MED ORDER — ACETAMINOPHEN 500 MG PO TABS
1000.0000 mg | ORAL_TABLET | ORAL | Status: AC
  Administered 2018-12-22: 1000 mg via ORAL
  Filled 2018-12-22: qty 2

## 2018-12-22 NOTE — Discharge Instructions (Addendum)
We believe that your symptoms are caused today by an exacerbation of your COPD, and possibly bronchitis and less likely COVID-19 (though you will need to take precautions in case you have it.. see below).  Please take the prescribed medications and any medications that you have at home for your COPD.  Follow up with your doctor as recommended.  If you develop any new or worsening symptoms, including but not limited to fever, persistent vomiting, worsening shortness of breath, or other symptoms that concern you, please return to the Emergency Department immediately.     Person Under Monitoring Name: Mary Lutz  Location: [address] [place] [address]   Infection Prevention Recommendations for Individuals Confirmed to have, or Being Evaluated for, 2019 Novel Coronavirus (COVID-19) Infection Who Receive Care at Home  Individuals who are confirmed to have, or are being evaluated for, COVID-19 should follow the prevention steps below until a healthcare provider or local or state health department says they can return to normal activities.  Stay home except to get medical care You should restrict activities outside your home, except for getting medical care. Do not go to work, school, or public areas, and do not use public transportation or taxis.  Call ahead before visiting your doctor Before your medical appointment, call the healthcare provider and tell them that you have, or are being evaluated for, COVID-19 infection. This will help the healthcare providers office take steps to keep other people from getting infected. Ask your healthcare provider to call the local or state health department.  Monitor your symptoms Seek prompt medical attention if your illness is worsening (e.g., difficulty breathing). Before going to your medical appointment, call the healthcare provider and tell them that you have, or are being evaluated for, COVID-19 infection. Ask your healthcare provider to  call the local or state health department.  Wear a facemask You should wear a facemask that covers your nose and mouth when you are in the same room with other people and when you visit a healthcare provider. People who live with or visit you should also wear a facemask while they are in the same room with you.  Separate yourself from other people in your home As much as possible, you should stay in a different room from other people in your home. Also, you should use a separate bathroom, if available.  Avoid sharing household items You should not share dishes, drinking glasses, cups, eating utensils, towels, bedding, or other items with other people in your home. After using these items, you should wash them thoroughly with soap and water.  Cover your coughs and sneezes Cover your mouth and nose with a tissue when you cough or sneeze, or you can cough or sneeze into your sleeve. Throw used tissues in a lined trash can, and immediately wash your hands with soap and water for at least 20 seconds or use an alcohol-based hand rub.  Wash your [facility] your hands often and thoroughly with soap and water for at least 20 seconds. You can use an alcohol-based hand sanitizer if soap and water are not available and if your hands are not visibly dirty. Avoid touching your eyes, nose, and mouth with unwashed hands.   Prevention Steps for Caregivers and Household Members of Individuals Confirmed to have, or Being Evaluated for, COVID-19 Infection Being Cared for in the Home  If you live with, or provide care at home for, a person confirmed to have, or being evaluated for, COVID-19  infection please follow these guidelines to prevent infection:  Follow healthcare providers instructions Make sure that you understand and can help the patient follow any healthcare provider instructions for all care.  Provide for the patients basic needs You should help the patient with basic needs in the home  and provide support for getting groceries, prescriptions, and other personal needs.  Monitor the patients symptoms If they are getting sicker, call his or her medical provider and tell them that the patient has, or is being evaluated for, COVID-19 infection. This will help the healthcare providers office take steps to keep other people from getting infected. Ask the healthcare provider to call the local or state health department.  Limit the number of people who have contact with the patient If possible, have only one caregiver for the patient. Other household members should stay in another home or place of residence. If this is not possible, they should stay in another room, or be separated from the patient as much as possible. Use a separate bathroom, if available. Restrict visitors who do not have an essential need to be in the home.  Keep older adults, very young children, and other sick people away from the patient Keep older adults, very young children, and those who have compromised immune systems or chronic health conditions away from the patient. This includes people with chronic heart, lung, or kidney conditions, diabetes, and cancer.  Ensure good ventilation Make sure that shared spaces in the home have good air flow, such as from an air conditioner or an opened window, weather permitting.  Wash your hands often Wash your hands often and thoroughly with soap and water for at least 20 seconds. You can use an alcohol based hand sanitizer if soap and water are not available and if your hands are not visibly dirty. Avoid touching your eyes, nose, and mouth with unwashed hands. Use disposable paper towels to dry your hands. If not available, use dedicated cloth towels and replace them when they become wet.  Wear a facemask and gloves Wear a disposable facemask at all times in the room and gloves when you touch or have contact with the patients blood, body fluids, and/or secretions  or excretions, such as sweat, saliva, sputum, nasal mucus, vomit, urine, or feces.  Ensure the mask fits over your nose and mouth tightly, and do not touch it during use. Throw out disposable facemasks and gloves after using them. Do not reuse. Wash your hands immediately after removing your facemask and gloves. If your personal clothing becomes contaminated, carefully remove clothing and launder. Wash your hands after handling contaminated clothing. Place all used disposable facemasks, gloves, and other waste in a lined container before disposing them with other household waste. Remove gloves and wash your hands immediately after handling these items.  Do not share dishes, glasses, or other household items with the patient Avoid sharing household items. You should not share dishes, drinking glasses, cups, eating utensils, towels, bedding, or other items with a patient who is confirmed to have, or being evaluated for, COVID-19 infection. After the person uses these items, you should wash them thoroughly with soap and water.  Wash laundry thoroughly Immediately remove and wash clothes or bedding that have blood, body fluids, and/or secretions or excretions, such as sweat, saliva, sputum, nasal mucus, vomit, urine, or feces, on them. Wear gloves when handling laundry from the patient. Read and follow directions on labels of laundry or clothing items and detergent. In general, wash and dry  with the warmest temperatures recommended on the label.  Clean all areas the individual has used often Clean all touchable surfaces, such as counters, tabletops, doorknobs, bathroom fixtures, toilets, phones, keyboards, tablets, and bedside tables, every day. Also, clean any surfaces that may have blood, body fluids, and/or secretions or excretions on them. Wear gloves when cleaning surfaces the patient has come in contact with. Use a diluted bleach solution (e.g., dilute bleach with 1 part bleach and 10 parts  water) or a household disinfectant with a label that says EPA-registered for coronaviruses. To make a bleach solution at home, add 1 tablespoon of bleach to 1 quart (4 cups) of water. For a larger supply, add  cup of bleach to 1 gallon (16 cups) of water. Read labels of cleaning products and follow recommendations provided on product labels. Labels contain instructions for safe and effective use of the cleaning product including precautions you should take when applying the product, such as wearing gloves or eye protection and making sure you have good ventilation during use of the product. Remove gloves and wash hands immediately after cleaning.  Monitor yourself for signs and symptoms of illness Caregivers and household members are considered close contacts, should monitor their health, and will be asked to limit movement outside of the home to the extent possible. Follow the monitoring steps for close contacts listed on the symptom monitoring form.   ? If you have additional questions, contact your local health department or call the epidemiologist on call at [phone] (available 24/7). ? This guidance is subject to change. For the most up-to-date guidance from [facility], please refer to their website: [email or web address]

## 2018-12-22 NOTE — ED Triage Notes (Signed)
Pt arrives POV for SOB, and dry cough. Pt seen by PCP and given prednisone and Azithromycin and has been using home duoneb's w/o relief. Pt has hx of COPD. Reports low grade fevers. Denies any recent travel or exposure. Sent here for evaluation by PCP.

## 2018-12-22 NOTE — ED Provider Notes (Signed)
[facility] Emergency Department Provider Note  ____________________________________________   None    (approximate)  I have reviewed the triage vital signs and the nursing notes.   HISTORY  Chief Complaint Shortness of Breath and Cough    HPI Mary Lutz is a 53 y.o. female that presents to the emergency department for evaluation of dry cough, SOB, chest tightness for 3 days. Patient was started on prednisone on Thursday, Azithromycin on Friday. Patient has been using duoneb treatments every 3-4 hours today with little relief. She continues to feel SOB. No known coronavirus exposure or travel history.       Past Medical History:  Diagnosis Date  . CHF (congestive heart failure) ([place])   . COPD (chronic obstructive pulmonary disease) ([place])   . Hypertension     There are no active problems to display for this patient.   History reviewed. No pertinent surgical history.  Prior to Admission medications   Medication Sig Start Date End Date Taking? Authorizing Provider  acetaZOLAMIDE (DIAMOX SEQUELS) 500 MG capsule Take 1 capsule (500 mg total) by mouth 2 (two) times daily. 11/12/18   Eula Listen, MD  lidocaine (LIDODERM) 5 % Place 1 patch onto the skin every 12 (twelve) hours. Remove & Discard patch within 12 hours or as directed by MD 02/18/18 02/18/19  Merlyn Lot, MD  meclizine (ANTIVERT) 25 MG tablet Take 1 tablet (25 mg total) by mouth 3 (three) times daily as needed for dizziness. 11/12/18   Nena Polio, MD  medroxyPROGESTERone (PROVERA) 10 MG tablet Take 10 mg by mouth daily.  02/05/18   [provider]  oxyCODONE-acetaminophen (PERCOCET) 5-325 MG tablet Take 1 tablet by mouth every 4 (four) hours as needed for severe pain. 11/12/18 11/12/19  Nena Polio, MD  oxyCODONE-acetaminophen (PERCOCET) 5-325 MG tablet Take 1 tablet by mouth every 6 (six) hours as needed for severe pain. 11/12/18 11/12/19  Eula Listen, MD   traMADol (ULTRAM) 50 MG tablet Take 1 tablet (50 mg total) by mouth every 6 (six) hours as needed. 07/07/18 07/07/19  Merlyn Lot, MD  warfarin (COUMADIN) 5 MG tablet Take 1 tablet (5MG ) by mouth Monday, Wednesday and Friday and 1 tablets (7.5MG ) by mouth Tuesday, Thursday, Saturday and Sunday 01/08/18   [provider]    Allergies Influenza vaccines; Morphine and related; Pneumococcal polysaccharide vaccine; and Toradol [ketorolac tromethamine]  History reviewed. No pertinent family history.  Social History Social History   Tobacco Use  . Smoking status: Current Some Day Smoker  . Smokeless tobacco: Never Used  Substance Use Topics  . Alcohol use: No  . Drug use: No    Review of Systems Constitutional: Positive for chills. ENT: Positive for rhinorrhea. Cardiovascular: No chest pain. Respiratory: Positive for cough, wheezing, SOB. Musculoskeletal: Negative for neck pain nor stiffness. Integumentary: Negative for rash. Neurological: No focal weakness nor numbness.   ____________________________________________   PHYSICAL EXAM:  VITAL SIGNS: ED Triage Vitals  Enc Vitals Group     BP 12/22/18 1624 (!) 155/125     Pulse Rate 12/22/18 1624 (!) 124     Resp 12/22/18 1624 20     Temp 12/22/18 1624 98.8 F (37.1 C)     Temp Source 12/22/18 1624 Oral     SpO2 12/22/18 1624 94 %     Weight 12/22/18 1628 270 lb (122.5 kg)     Height 12/22/18 1628 5\' 7"  (1.702 m)     Head Circumference --  Peak Flow --      Pain Score 12/22/18 1627 7     Pain Loc --      Pain Edu? --      Excl. in [place]? --     Constitutional: Alert and oriented. Generally well appearing and in no acute distress. Eyes: Conjunctivae are normal.  Nose: No congestion Neck: No stridor.   Cardiovascular: Grossly normal heart sounds. Respiratory: Mildly increased work of breathing. Scattered wheezes.  Musculoskeletal: No lower extremity tenderness nor edema. No gross deformities of  extremities. Neurologic:  Normal speech and language. No gross focal neurologic deficits are appreciated.  Skin:  Skin is warm, dry and intact. No rash noted.   ____________________________________________   LABS (all labs ordered are listed, but only abnormal results are displayed)  Labs Reviewed - No data to display  ____________________________________________   RADIOLOGY   Official radiology report(s): No results found.  ____________________________________________    INITIAL IMPRESSION / MDM / ASSESSMENT AND PLAN / ED COURSE      Patient presented to the emergency department for evaluation of SOB, CP for 3 days.  Patient has already been started on antibiotics and steroids.  She does have some wheezing and some mildly increased work of breathing.  She will be further evaluated in the emergency department.  ____________________________________________  FINAL CLINICAL IMPRESSION(S) / ED DIAGNOSES  Final diagnoses:  None        Note:  This document was prepared using Dragon voice recognition software and may include unintentional dictation errors.   Laban Emperor, PA-C 12/22/18 Claybon Jabs, MD 12/22/18 2224

## 2018-12-22 NOTE — ED Notes (Signed)
Pt walked around the room with pulse ox staying 95%-100% and HR staying from 98-105.

## 2018-12-22 NOTE — ED Provider Notes (Signed)
[facility] Emergency Department Provider Note   ____________________________________________   First MD Initiated Contact with Patient 12/22/18 1724     (approximate)  I have reviewed the triage vital signs and the nursing notes.   HISTORY  Chief Complaint Shortness of Breath and Cough    HPI Mary Lutz is a 53 y.o. female is a history of COPD congestive heart failure and previous blood clots  Patient reports that she is been having fevers cough chills for about 5 days now.  She has been on azithromycin which she is almost done with and also today is the last day of her steroid course  She is continued to feel short of breath, coughing frequently, wheezing, having fevers and body aches.  A runny nose as well.  No known exposure to coronavirus or travel history  She denies chest pain.  No new leg swelling.  She is on warfarin   Past Medical History:  Diagnosis Date   CHF (congestive heart failure) (HCC)    COPD (chronic obstructive pulmonary disease) ([place])    Hypertension     There are no active problems to display for this patient.   History reviewed. No pertinent surgical history.  Prior to Admission medications   Medication Sig Start Date End Date Taking? Authorizing Provider  acetaZOLAMIDE (DIAMOX SEQUELS) 500 MG capsule Take 1 capsule (500 mg total) by mouth 2 (two) times daily. 11/12/18   Eula Listen, MD  lidocaine (LIDODERM) 5 % Place 1 patch onto the skin every 12 (twelve) hours. Remove & Discard patch within 12 hours or as directed by MD 02/18/18 02/18/19  Merlyn Lot, MD  meclizine (ANTIVERT) 25 MG tablet Take 1 tablet (25 mg total) by mouth 3 (three) times daily as needed for dizziness. 11/12/18   Nena Polio, MD  medroxyPROGESTERone (PROVERA) 10 MG tablet Take 10 mg by mouth daily.  02/05/18   [provider]  methylPREDNISolone (MEDROL DOSEPAK) 4 MG TBPK tablet Use as directed on Medrol dose pack.  12/22/18   Delman Kitten, MD  oxyCODONE-acetaminophen (PERCOCET) 5-325 MG tablet Take 1 tablet by mouth every 4 (four) hours as needed for severe pain. 11/12/18 11/12/19  Nena Polio, MD  oxyCODONE-acetaminophen (PERCOCET) 5-325 MG tablet Take 1 tablet by mouth every 6 (six) hours as needed for severe pain. 11/12/18 11/12/19  Eula Listen, MD  traMADol (ULTRAM) 50 MG tablet Take 1 tablet (50 mg total) by mouth every 6 (six) hours as needed. 07/07/18 07/07/19  Merlyn Lot, MD  warfarin (COUMADIN) 5 MG tablet Take 1 tablet (5MG ) by mouth Monday, Wednesday and Friday and 1 tablets (7.5MG ) by mouth Tuesday, Thursday, Saturday and Sunday 01/08/18   [provider]    Allergies Influenza vaccines; Morphine and related; Pneumococcal polysaccharide vaccine; and Toradol [ketorolac tromethamine]  History reviewed. No pertinent family history.  Social History Social History   Tobacco Use   Smoking status: Current Some Day Smoker   Smokeless tobacco: Never Used  Substance Use Topics   Alcohol use: No   Drug use: No    Review of Systems Constitutional: Fevers and chills for the last 5 days Eyes: No visual changes. ENT: No sore throat. Cardiovascular: Denies chest pain. Respiratory: See HPI Gastrointestinal: No abdominal pain.   Genitourinary: Negative for dysuria. Musculoskeletal: Negative for back pain. Skin: Negative for rash. Neurological: Negative for headaches, areas of focal weakness or numbness.    ____________________________________________   PHYSICAL EXAM:  VITAL SIGNS: ED Triage Vitals  Enc Vitals Group     BP 12/22/18 1624 (!) 155/125     Pulse Rate 12/22/18 1624 (!) 124     Resp 12/22/18 1624 20     Temp 12/22/18 1624 98.8 F (37.1 C)     Temp Source 12/22/18 1624 Oral     SpO2 12/22/18 1624 94 %     Weight 12/22/18 1628 270 lb (122.5 kg)     Height 12/22/18 1628 5\' 7"  (1.702 m)     Head Circumference --      Peak Flow --      Pain Score  12/22/18 1627 7     Pain Loc --      Pain Edu? --      Excl. in [place]? --     Constitutional: Alert and oriented.  Mildly ill-appearing.  Slightly tachypneic.  Speaks in phrases. Eyes: Conjunctivae are normal. Head: Atraumatic. Nose: No congestion/rhinnorhea. Mouth/Throat: Mucous membranes are moist. Neck: No stridor.  Cardiovascular: Just slightly tachycardic rate, regular rhythm. Grossly normal heart sounds.  Good peripheral circulation. Respiratory: Very mildly increased work of breathing.  Notable end expiratory wheezing with central rhonchi throughout.  Speaks in phrases.  Mild use of accessory muscles.  No tripoding or splinting. Gastrointestinal: Soft and nontender. No distention. Musculoskeletal: No lower extremity tenderness nor edema. Neurologic:  Normal speech and language. No gross focal neurologic deficits are appreciated.  Skin:  Skin is warm, dry and intact. No rash noted. Psychiatric: Mood and affect are normal. Speech and behavior are normal.  ____________________________________________   LABS (all labs ordered are listed, but only abnormal results are displayed)  Labs Reviewed  CBC - Abnormal; Notable for the following components:      Result Value   WBC 15.4 (*)    Hemoglobin 15.1 (*)    All other components within normal limits  BASIC METABOLIC PANEL - Abnormal; Notable for the following components:   Glucose, Bld 136 (*)    Calcium 8.6 (*)    All other components within normal limits  PROTIME-INR - Abnormal; Notable for the following components:   Prothrombin Time 23.8 (*)    INR 2.2 (*)    All other components within normal limits  INFLUENZA PANEL BY PCR (TYPE A & B)   ____________________________________________  EKG  ED ECG REPORT I, Delman Kitten, the attending physician, personally viewed and interpreted this ECG.  Date: 12/22/2018 EKG Time: 1735 Rate: 75 Rhythm: normal sinus rhythm QRS Axis: normal Intervals: normal ST/T Wave abnormalities:  normal Narrative Interpretation: no evidence of acute ischemia  ____________________________________________  RADIOLOGY  Dg Chest 1 View  Result Date: 12/22/2018 CLINICAL DATA:  53 year old with cough and shortness of breath. EXAM: CHEST  1 VIEW COMPARISON:  09/04/2011 and 12/14/2008 FINDINGS: Again noted are prominent lung markings which have minimally changed. No focal airspace disease. Heart and mediastinum are within normal limits. Trachea is midline. Degenerative changes at the right [facility] joint. IMPRESSION: No acute chest findings. Electronically Signed   By: Markus Daft M.D.   On: 12/22/2018 17:14   Ct Chest Wo Contrast  Result Date: 12/22/2018 CLINICAL DATA:  53 year old female with shortness of breath and nonproductive cough. Recently treated with prednisone and azithromycin. Low-grade fever. EXAM: CT CHEST WITHOUT CONTRAST TECHNIQUE: Multidetector CT imaging of the chest was performed following the standard protocol without IV contrast. COMPARISON:  Portable chest radiograph earlier today. Chest CT 09/04/2011. FINDINGS: Cardiovascular: No cardiomegaly or pericardial effusion. Minimal thoracic aortic calcified atherosclerosis. Vascular patency is not evaluated  in the absence of IV contrast. Mediastinum/Nodes: Negative.  No lymphadenopathy. Lungs/Pleura: Major airways are patent.  Mild respiratory motion. Larger lung volumes compared to 2012. Minimal subpleural ground-glass opacity on the right series 3, image 37 in the upper lobe. This more resembles scarring than acute inflammation and may have been present to a lesser extent in 2012 (series 6, image 27 of that exam). There is a stable small left costophrenic angle subpleural nodule on series 3, image 99 which is benign. No other abnormal pulmonary opacity. No pleural effusion. Upper Abdomen: Negative visible noncontrast liver, spleen, pancreas, right adrenal gland, kidneys, and stomach. Stable mild left adrenal nodularity since 2012 compatible  with small benign adenoma. Musculoskeletal: Mild motion artifact at the T8-T9 level. Chronic lower thoracic disc osteophyte complex on the left is stable since 2012. No acute osseous abnormality identified. IMPRESSION: 1. No convincing acute pulmonary process. Minimal subpleural scarring suspected in the right upper lobe, and there is a stable and benign small left lower lobe lung nodule. 2. Chronic left adrenal adenoma. Electronically Signed   By: Genevie Ann M.D.   On: 12/22/2018 20:16    Chest x-ray reviewed negative for acute disease ____________________________________________   PROCEDURES  Procedure(s) performed none  Procedures  Critical Care performed: No  ____________________________________________   INITIAL IMPRESSION / ASSESSMENT AND PLAN / ED COURSE  Pertinent labs & imaging results that were available during my care of the patient were reviewed by me and considered in my medical decision making (see chart for details).   Patient presents with about 5 days of shortness of breath.  Reports persistence despite treatment through course of prednisone and also azithromycin.  Does have mild increased work of breathing, wheezing throughout.  Normal oxygen saturations.  Slightly tachypneic.  She denies any chest pain, and she is currently on warfarin for treatment of blood clots and is therapeutic, without chest pain, hypoxia and with a therapeutic INR I highly doubt pulmonary embolism.  She also reports infectious symptoms, fevers chills cough and wheezing.  We will continue to treat for COPD including inhaler treatments, she is currently on prednisone, currently on azithromycin.  Additional treatments here and then plan to reassess.  Her ambulatory pulse ox is normal.  ----------------------------------------- 7:33 PM on 12/22/2018 -----------------------------------------  Patient reports she is starting to feel slightly better.  Review of her imaging and labs, and slight ongoing  increased work of breathing elevated white count which may be due to steroids will obtain a CT of the chest to evaluate further and exclude in particular pneumonia given the long Jevity of her symptomatology.  Patient is in agreement  Clinical Course as of Dec 22 2106  Mon Dec 22, 2018  2104 Patient appears well.  Speaking in full clear sentences.  Oxygen saturation 97 to 98% on room air.  Heart rate normal.  Blood pressure 379 systolic.  She reports she is feeling much better she appears well, patient got up and ambulated well and appears much improved.  We will continue her on a steroid taper   [MQ]    Clinical Course User Index [MQ] Delman Kitten, MD   All signs of her increased work of breathing are gone now.  Normal rate clear in full sentences.  Lung sounds improved.  Resting comfortably ambulating well.  She is responded very well to treatment here.  ----------------------------------------- 9:08 PM on 12/22/2018 -----------------------------------------  Patient comfortable with plan to go home.  Much improved.  Though I do not suspect it highly,  the patient is given self quarantine information in case this is coronavirus.  She is in agreement with plan and we discussed careful return precautions, especially as her doctor's office is likely not open.  Should return to ER if she experiences worsening symptoms or new concerns.  Will place her on an extended Dosepak of Solu-Medrol with appropriate taper given her prolonged COPD exacerbation.  Patient is in agreement. ____________________________________________   FINAL CLINICAL IMPRESSION(S) / ED DIAGNOSES  Final diagnoses:  Viral URI with cough  COPD exacerbation ([place])        Note:  This document was prepared using Dragon voice recognition software and may include unintentional dictation errors       Delman Kitten, MD 12/22/18 2110

## 2018-12-22 NOTE — ED Notes (Signed)
Topaz pad not working at this time. Pt understands all d/c instructions. Pt wheeled out by this RN to car.

## 2018-12-22 NOTE — ED Notes (Signed)
Pt walked around room without assistance with oxygen sats staying from 95-100%. Pt reports tightness in her chest but this goes with her symptoms and dx. MD notified. Pt does feel safe going home.

## 2019-03-17 ENCOUNTER — Other Ambulatory Visit: Payer: Self-pay

## 2019-03-17 ENCOUNTER — Observation Stay
Admission: EM | Admit: 2019-03-17 | Discharge: 2019-03-19 | Disposition: A | Discharge status: Home / Self Care | Attending: Internal Medicine | Admitting: Internal Medicine

## 2019-03-17 ENCOUNTER — Emergency Department

## 2019-03-17 DIAGNOSIS — Z8249 Family history of ischemic heart disease and other diseases of the circulatory system: Secondary | ICD-10-CM | POA: Insufficient documentation

## 2019-03-17 DIAGNOSIS — Z6841 Body Mass Index (BMI) 40.0 and over, adult: Secondary | ICD-10-CM | POA: Insufficient documentation

## 2019-03-17 DIAGNOSIS — I509 Heart failure, unspecified: Secondary | ICD-10-CM | POA: Insufficient documentation

## 2019-03-17 DIAGNOSIS — Z793 Long term (current) use of hormonal contraceptives: Secondary | ICD-10-CM | POA: Diagnosis not present

## 2019-03-17 DIAGNOSIS — I38 Endocarditis, valve unspecified: Secondary | ICD-10-CM | POA: Insufficient documentation

## 2019-03-17 DIAGNOSIS — I110 Hypertensive heart disease with heart failure: Secondary | ICD-10-CM | POA: Insufficient documentation

## 2019-03-17 DIAGNOSIS — F329 Major depressive disorder, single episode, unspecified: Secondary | ICD-10-CM | POA: Diagnosis not present

## 2019-03-17 DIAGNOSIS — F17200 Nicotine dependence, unspecified, uncomplicated: Secondary | ICD-10-CM | POA: Diagnosis not present

## 2019-03-17 DIAGNOSIS — Z1159 Encounter for screening for other viral diseases: Secondary | ICD-10-CM | POA: Diagnosis not present

## 2019-03-17 DIAGNOSIS — Z79899 Other long term (current) drug therapy: Secondary | ICD-10-CM | POA: Insufficient documentation

## 2019-03-17 DIAGNOSIS — Z716 Tobacco abuse counseling: Secondary | ICD-10-CM | POA: Insufficient documentation

## 2019-03-17 DIAGNOSIS — I252 Old myocardial infarction: Secondary | ICD-10-CM | POA: Insufficient documentation

## 2019-03-17 DIAGNOSIS — Z86718 Personal history of other venous thrombosis and embolism: Secondary | ICD-10-CM | POA: Insufficient documentation

## 2019-03-17 DIAGNOSIS — J209 Acute bronchitis, unspecified: Principal | ICD-10-CM | POA: Insufficient documentation

## 2019-03-17 DIAGNOSIS — E782 Mixed hyperlipidemia: Secondary | ICD-10-CM | POA: Diagnosis not present

## 2019-03-17 DIAGNOSIS — R61 Generalized hyperhidrosis: Secondary | ICD-10-CM | POA: Insufficient documentation

## 2019-03-17 DIAGNOSIS — Z7901 Long term (current) use of anticoagulants: Secondary | ICD-10-CM | POA: Diagnosis not present

## 2019-03-17 DIAGNOSIS — Z885 Allergy status to narcotic agent status: Secondary | ICD-10-CM | POA: Diagnosis not present

## 2019-03-17 DIAGNOSIS — J440 Chronic obstructive pulmonary disease with acute lower respiratory infection: Secondary | ICD-10-CM | POA: Diagnosis not present

## 2019-03-17 DIAGNOSIS — I2510 Atherosclerotic heart disease of native coronary artery without angina pectoris: Secondary | ICD-10-CM | POA: Insufficient documentation

## 2019-03-17 DIAGNOSIS — Z86711 Personal history of pulmonary embolism: Secondary | ICD-10-CM | POA: Diagnosis not present

## 2019-03-17 DIAGNOSIS — R079 Chest pain, unspecified: Secondary | ICD-10-CM | POA: Diagnosis present

## 2019-03-17 DIAGNOSIS — R202 Paresthesia of skin: Secondary | ICD-10-CM | POA: Diagnosis not present

## 2019-03-17 DIAGNOSIS — Z888 Allergy status to other drugs, medicaments and biological substances status: Secondary | ICD-10-CM | POA: Insufficient documentation

## 2019-03-17 DIAGNOSIS — I200 Unstable angina: Secondary | ICD-10-CM | POA: Diagnosis present

## 2019-03-17 HISTORY — DX: Acute myocardial infarction, unspecified: I21.9

## 2019-03-17 LAB — COMPREHENSIVE METABOLIC PANEL
ALT: 15 U/L (ref 0–44)
AST: 17 U/L (ref 15–41)
Albumin: 3.7 g/dL (ref 3.5–5.0)
Alkaline Phosphatase: 68 U/L (ref 38–126)
Anion gap: 12 (ref 5–15)
BUN: 14 mg/dL (ref 6–20)
CO2: 23 mmol/L (ref 22–32)
Calcium: 8.5 mg/dL — ABNORMAL LOW (ref 8.9–10.3)
Chloride: 105 mmol/L (ref 98–111)
Creatinine, Ser: 0.62 mg/dL (ref 0.44–1.00)
GFR calc Af Amer: >60 mL/min (ref >60)
GFR calc non Af Amer: >60 mL/min (ref >60)
Glucose, Bld: 120 mg/dL — ABNORMAL HIGH (ref 70–99)
Potassium: 3.7 mmol/L (ref 3.5–5.1)
Sodium: 140 mmol/L (ref 135–145)
Total Bilirubin: 0.2 mg/dL — ABNORMAL LOW (ref 0.3–1.2)
Total Protein: 7 g/dL (ref 6.5–8.1)

## 2019-03-17 LAB — PROTIME-INR
INR: 2 — ABNORMAL HIGH (ref 0.8–1.2)
Prothrombin Time: 22.8 seconds — ABNORMAL HIGH (ref 11.4–15.2)

## 2019-03-17 LAB — CBC WITH DIFFERENTIAL/PLATELET
Abs Immature Granulocytes: 0.03 10*3/uL (ref 0.00–0.07)
Basophils Absolute: 0.1 10*3/uL (ref 0.0–0.1)
Basophils Relative: 1 %
Eosinophils Absolute: 0.2 10*3/uL (ref 0.0–0.5)
Eosinophils Relative: 2 %
HCT: 44.2 % (ref 36.0–46.0)
Hemoglobin: 14.8 g/dL (ref 12.0–15.0)
Immature Granulocytes: 0 %
Lymphocytes Relative: 27 %
Lymphs Abs: 3.5 10*3/uL (ref 0.7–4.0)
MCH: 31.7 pg (ref 26.0–34.0)
MCHC: 33.5 g/dL (ref 30.0–36.0)
MCV: 94.6 fL (ref 80.0–100.0)
Monocytes Absolute: 1.6 10*3/uL — ABNORMAL HIGH (ref 0.1–1.0)
Monocytes Relative: 12 %
Neutro Abs: 7.5 10*3/uL (ref 1.7–7.7)
Neutrophils Relative %: 58 %
Platelets: 365 10*3/uL (ref 150–400)
RBC: 4.67 MIL/uL (ref 3.87–5.11)
RDW: 13.1 % (ref 11.5–15.5)
WBC: 12.8 10*3/uL — ABNORMAL HIGH (ref 4.0–10.5)
nRBC: 0 % (ref 0.0–0.2)

## 2019-03-17 LAB — BRAIN NATRIURETIC PEPTIDE: B Natriuretic Peptide: 44 pg/mL (ref 0.0–100.0)

## 2019-03-17 LAB — TROPONIN I: Troponin I: <0.03 ng/mL (ref <0.03)

## 2019-03-17 MED ORDER — NITROGLYCERIN 2 % TD OINT
1.0000 [in_us] | TOPICAL_OINTMENT | Freq: Once | TRANSDERMAL | Status: AC
  Administered 2019-03-17: 1 [in_us] via TOPICAL
  Filled 2019-03-17: qty 1

## 2019-03-17 MED ORDER — IOPAMIDOL (ISOVUE-370) INJECTION 76%
100.0000 mL | Freq: Once | INTRAVENOUS | Status: AC | PRN
  Administered 2019-03-18: 100 mL via INTRAVENOUS

## 2019-03-17 NOTE — ED Triage Notes (Addendum)
Pt in via EMS from home d/t sudden CP while sitting and playing on computer. Pt states all that is going on right now is stressing her out. Pt history of MI/CVA/clotting disorder. Pt also c/o L arm numbness. EKG with EMS ST. 98.8 oral/ 20g IV placed by EMS; pt on coumadin; EMS gave ASA325mg /Nitro x3; pain continues at 8/10 stabbing. Pt states initially SOB/sweaty. Pt resp WDL and dry currently.

## 2019-03-17 NOTE — ED Provider Notes (Signed)
[facility] Emergency Department Provider Note   ____________________________________________   First MD Initiated Contact with Patient 03/17/19 2331     (approximate)  I have reviewed the triage vital signs and the nursing notes.   HISTORY  Chief Complaint Chest Pain     HPI Mary Lutz is a 53 y.o. female brought to the ED from home with a chief complaint of chest pain.  Patient has a history of COPD, hypertension, CHF, DVT/PE on warfarin who complains of sudden onset left-sided chest pressure while playing on the computer.  Experience dyspnea on exertion while ambulating to the kitchen.  Symptoms associated with diaphoresis.  Denies associated nausea/vomiting, palpitations or dizziness.  Has had worsening nonproductive cough this week.  Denies fever, abdominal pain, dysuria, diarrhea.  Denies recent travel, trauma or exposure to persons diagnosed with coronavirus.       Past Medical History:  Diagnosis Date  . CHF (congestive heart failure) ([place])   . COPD (chronic obstructive pulmonary disease) ([place])   . Hypertension     There are no active problems to display for this patient.   History reviewed. No pertinent surgical history.  Prior to Admission medications   Medication Sig Start Date End Date Taking? Authorizing Provider  acetaZOLAMIDE (DIAMOX SEQUELS) 500 MG capsule Take 1 capsule (500 mg total) by mouth 2 (two) times daily. 11/12/18   Eula Listen, MD  meclizine (ANTIVERT) 25 MG tablet Take 1 tablet (25 mg total) by mouth 3 (three) times daily as needed for dizziness. 11/12/18   Nena Polio, MD  medroxyPROGESTERone (PROVERA) 10 MG tablet Take 10 mg by mouth daily.  02/05/18   [provider]  methylPREDNISolone (MEDROL DOSEPAK) 4 MG TBPK tablet Use as directed on Medrol dose pack. 12/22/18   Delman Kitten, MD  oxyCODONE-acetaminophen (PERCOCET) 5-325 MG tablet Take 1 tablet by mouth every 4 (four) hours as needed for severe  pain. 11/12/18 11/12/19  Nena Polio, MD  oxyCODONE-acetaminophen (PERCOCET) 5-325 MG tablet Take 1 tablet by mouth every 6 (six) hours as needed for severe pain. 11/12/18 11/12/19  Eula Listen, MD  traMADol (ULTRAM) 50 MG tablet Take 1 tablet (50 mg total) by mouth every 6 (six) hours as needed. 07/07/18 07/07/19  Merlyn Lot, MD  warfarin (COUMADIN) 5 MG tablet Take 1 tablet (5MG ) by mouth Monday, Wednesday and Friday and 1 tablets (7.5MG ) by mouth Tuesday, Thursday, Saturday and Sunday 01/08/18   [provider]    Allergies Influenza vaccines, Morphine and related, Pneumococcal polysaccharide vaccine, and Toradol [ketorolac tromethamine]  History reviewed. No pertinent family history.  Social History Social History   Tobacco Use  . Smoking status: Current Some Day Smoker  . Smokeless tobacco: Never Used  Substance Use Topics  . Alcohol use: No  . Drug use: No    Review of Systems  Constitutional: No fever/chills Eyes: No visual changes. ENT: No sore throat. Cardiovascular: Positive for chest pain. Respiratory: Positive for shortness of breath. Gastrointestinal: No abdominal pain.  No nausea, no vomiting.  No diarrhea.  No constipation. Genitourinary: Negative for dysuria. Musculoskeletal: Negative for back pain. Skin: Negative for rash. Neurological: Negative for headaches, focal weakness or numbness.   ____________________________________________   PHYSICAL EXAM:  VITAL SIGNS: ED Triage Vitals  Enc Vitals Group     BP 03/17/19 2300 139/80     Pulse Rate 03/17/19 2258 (!) 107     Resp 03/17/19 2258 12     Temp 03/17/19 2259 98.1  F (36.7 C)     Temp Source 03/17/19 2259 Oral     SpO2 03/17/19 2258 98 %     Weight 03/17/19 2301 297 lb (134.7 kg)     Height 03/17/19 2301 5\' 7"  (1.702 m)     Head Circumference --      Peak Flow --      Pain Score 03/17/19 2300 8     Pain Loc --      Pain Edu? --      Excl. in [place]? --      Constitutional: Alert and oriented. Well appearing and in mild acute distress. Eyes: Conjunctivae are normal. PERRL. EOMI. Head: Atraumatic. Nose: No congestion/rhinnorhea. Mouth/Throat: Mucous membranes are moist.  Oropharynx non-erythematous. Neck: No stridor.   Cardiovascular: Normal rate, regular rhythm. Grossly normal heart sounds.  Good peripheral circulation. Respiratory: Normal respiratory effort.  No retractions. Lungs CTAB. Gastrointestinal: Obese.  Soft and nontender. No distention. No abdominal bruits. No CVA tenderness. Musculoskeletal: No lower extremity tenderness nor edema.  No joint effusions. Neurologic:  Normal speech and language. No gross focal neurologic deficits are appreciated. No gait instability. Skin:  Skin is warm, dry and intact. No rash noted. Psychiatric: Mood and affect are normal. Speech and behavior are normal.  ____________________________________________   LABS (all labs ordered are listed, but only abnormal results are displayed)  Labs Reviewed  CBC WITH DIFFERENTIAL/PLATELET - Abnormal; Notable for the following components:      Result Value   WBC 12.8 (*)    Monocytes Absolute 1.6 (*)    All other components within normal limits  COMPREHENSIVE METABOLIC PANEL - Abnormal; Notable for the following components:   Glucose, Bld 120 (*)    Calcium 8.5 (*)    Total Bilirubin 0.2 (*)    All other components within normal limits  PROTIME-INR - Abnormal; Notable for the following components:   Prothrombin Time 22.8 (*)    INR 2.0 (*)    All other components within normal limits  SARS CORONAVIRUS 2 (HOSPITAL ORDER, [facility])  BRAIN NATRIURETIC PEPTIDE  TROPONIN I   ____________________________________________  EKG  ED ECG REPORT I, , J, the attending physician, personally viewed and interpreted this ECG.   Date: 03/17/2019  EKG Time: 2258  Rate: 105  Rhythm: sinus tachycardia  Axis: Normal   Intervals:none  ST&T Change: Nonspecific  ____________________________________________  RADIOLOGY  ED MD interpretation: Bronchitis versus pulmonary edema  Official radiology report(s): Ct Angio Chest Pe W/cm &/or Wo Cm  Result Date: 03/18/2019 CLINICAL DATA:  53 y/o F; sudden onset chest pain and shortness of breath. History of pulmonary embolus. EXAM: CT ANGIOGRAPHY CHEST WITH CONTRAST TECHNIQUE: Multidetector CT imaging of the chest was performed using the standard protocol during bolus administration of intravenous contrast. Multiplanar CT image reconstructions and MIPs were obtained to evaluate the vascular anatomy. CONTRAST:  149mL ISOVUE-370 IOPAMIDOL (ISOVUE-370) INJECTION 76% COMPARISON:  12/22/2018 CTA of the chest. FINDINGS: Cardiovascular: Satisfactory opacification of the pulmonary arteries to the segmental level. No evidence of pulmonary embolism. Normal heart size. No pericardial effusion. Mild aortic calcific atherosclerosis. Mediastinum/Nodes: No enlarged mediastinal, hilar, or axillary lymph nodes. Thyroid gland, trachea, and esophagus demonstrate no significant findings. Lungs/Pleura: Stable 5 mm left lower lobe small peripheral pulmonary nodule compatible benign etiology (series 6, image 68). Otherwise lungs are clear. No pleural effusion or pneumothorax. Upper Abdomen: No acute abnormality. Musculoskeletal: No chest wall abnormality. No acute or significant osseous findings. Review of the MIP  images confirms the above findings. IMPRESSION: 1. No pulmonary embolus identified. 2. Mild aortic calcific atherosclerosis. 3. Otherwise unremarkable CTA of the chest. Electronically Signed   By: Kristine Garbe M.D.   On: 03/18/2019 00:38   Dg Chest Port 1 View  Result Date: 03/17/2019 CLINICAL DATA:  Sudden onset of chest pain. EXAM: PORTABLE CHEST 1 VIEW COMPARISON:  Radiograph and CT 12/22/2018 FINDINGS: Heart is normal in size. Normal mediastinal contours. Development of  peribronchial and interstitial thickening. No focal airspace disease. No pleural effusion or pneumothorax. Degenerative change of both acromioclavicular joints. IMPRESSION: Development of peribronchial and interstitial thickening, may be bronchitis, pulmonary edema felt less likely given normal heart size. Electronically Signed   By: Keith Rake M.D.   On: 03/17/2019 23:31    ____________________________________________   PROCEDURES  Procedure(s) performed (including Critical Care):  Procedures   ____________________________________________   INITIAL IMPRESSION / ASSESSMENT AND PLAN / ED COURSE  As part of my medical decision making, I reviewed the following data within the [facility] notes reviewed and incorporated, Labs reviewed, EKG interpreted, Old chart reviewed, Radiograph reviewed, Discussed with admitting physician  and Notes from prior ED visits     Mary Lutz was evaluated in Emergency Department on 03/18/2019 for the symptoms described in the history of present illness. She was evaluated in the context of the global COVID-19 pandemic, which necessitated consideration that the patient might be at risk for infection with the SARS-CoV-2 virus that causes COVID-19. Institutional protocols and algorithms that pertain to the evaluation of patients at risk for COVID-19 are in a state of rapid change based on information released by regulatory bodies including the CDC and federal and state organizations. These policies and algorithms were followed during the patient's care in the ED.   53 year old female with COPD, CHF, hypertension, on warfarin for PE/DVT who presents with left-sided chest pain. Differential diagnosis includes, but is not limited to, ACS, aortic dissection, pulmonary embolism, cardiac tamponade, pneumothorax, pneumonia, pericarditis, myocarditis, GI-related causes including esophagitis/gastritis, and musculoskeletal chest wall pain.       Clinical Course as of Mar 17 144  Wed Mar 18, 2019  0144 Updated patient on all test results.  Chest pain improved after sublingual nitroglycerin.  Will discuss with hospitalist to evaluate patient in the emergency department for admission.   [JS]    Clinical Course User Index [JS] Paulette Blanch, MD     ____________________________________________   FINAL CLINICAL IMPRESSION(S) / ED DIAGNOSES  Final diagnoses:  Chest pain, unspecified type  Unstable angina [facility])     ED Discharge Orders    None       Note:  This document was prepared using Dragon voice recognition software and may include unintentional dictation errors.   Paulette Blanch, MD 03/18/19 [phone]

## 2019-03-17 NOTE — ED Notes (Signed)
EKG completed

## 2019-03-17 NOTE — ED Notes (Addendum)
Lav/grn/blue tubes pulled off of EMS IV. Pt states didn't take night meds yet including inhaler and coumadin.

## 2019-03-18 ENCOUNTER — Encounter: Payer: Self-pay | Admitting: *Deleted

## 2019-03-18 ENCOUNTER — Other Ambulatory Visit: Payer: Self-pay

## 2019-03-18 DIAGNOSIS — R079 Chest pain, unspecified: Secondary | ICD-10-CM | POA: Diagnosis present

## 2019-03-18 LAB — TROPONIN I
Troponin I: <0.03 ng/mL (ref <0.03)
Troponin I: <0.03 ng/mL (ref <0.03)
Troponin I: <0.03 ng/mL (ref <0.03)

## 2019-03-18 LAB — TSH: TSH: 1.449 u[IU]/mL (ref 0.350–4.500)

## 2019-03-18 LAB — SARS CORONAVIRUS 2 BY RT PCR (HOSPITAL ORDER, PERFORMED IN ~~LOC~~ HOSPITAL LAB): SARS Coronavirus 2: NEGATIVE

## 2019-03-18 MED ORDER — ONDANSETRON HCL 4 MG/2ML IJ SOLN
4.0000 mg | Freq: Once | INTRAMUSCULAR | Status: AC
  Administered 2019-03-18: 4 mg via INTRAVENOUS
  Filled 2019-03-18: qty 2

## 2019-03-18 MED ORDER — DOCUSATE SODIUM 100 MG PO CAPS
100.0000 mg | ORAL_CAPSULE | Freq: Two times a day (BID) | ORAL | Status: DC
  Administered 2019-03-18 (×2): 100 mg via ORAL
  Filled 2019-03-18 (×3): qty 1

## 2019-03-18 MED ORDER — ACETAMINOPHEN 325 MG PO TABS
650.0000 mg | ORAL_TABLET | Freq: Four times a day (QID) | ORAL | Status: DC | PRN
  Administered 2019-03-18: 650 mg via ORAL
  Filled 2019-03-18: qty 2

## 2019-03-18 MED ORDER — WARFARIN SODIUM 5 MG PO TABS: 5.0000 mg | ORAL_TABLET | Freq: Every day | ORAL | Status: DC

## 2019-03-18 MED ORDER — SERTRALINE HCL 50 MG PO TABS
100.0000 mg | ORAL_TABLET | Freq: Every day | ORAL | Status: DC
  Administered 2019-03-18 – 2019-03-19 (×2): 100 mg via ORAL
  Filled 2019-03-18 (×2): qty 2

## 2019-03-18 MED ORDER — ONDANSETRON HCL 4 MG/2ML IJ SOLN: 4.0000 mg | Freq: Four times a day (QID) | INTRAMUSCULAR | Status: DC | PRN

## 2019-03-18 MED ORDER — NITROGLYCERIN 0.4 MG SL SUBL: 0.4000 mg | SUBLINGUAL_TABLET | SUBLINGUAL | Status: DC | PRN

## 2019-03-18 MED ORDER — ONDANSETRON HCL 4 MG PO TABS: 4.0000 mg | ORAL_TABLET | Freq: Four times a day (QID) | ORAL | Status: DC | PRN

## 2019-03-18 MED ORDER — NICOTINE 14 MG/24HR TD PT24
14.0000 mg | MEDICATED_PATCH | Freq: Every day | TRANSDERMAL | Status: DC
  Administered 2019-03-18 – 2019-03-19 (×2): 14 mg via TRANSDERMAL
  Filled 2019-03-18 (×2): qty 1

## 2019-03-18 MED ORDER — WARFARIN SODIUM 7.5 MG PO TABS: 7.5000 mg | ORAL_TABLET | ORAL | Status: DC

## 2019-03-18 MED ORDER — UMECLIDINIUM-VILANTEROL 62.5-25 MCG/INH IN AEPB
1.0000 | INHALATION_SPRAY | Freq: Every day | RESPIRATORY_TRACT | Status: DC
  Administered 2019-03-18 – 2019-03-19 (×2): 1 via RESPIRATORY_TRACT
  Filled 2019-03-18: qty 14

## 2019-03-18 MED ORDER — ACETAMINOPHEN 650 MG RE SUPP: 650.0000 mg | Freq: Four times a day (QID) | RECTAL | Status: DC | PRN

## 2019-03-18 MED ORDER — MEDROXYPROGESTERONE ACETATE 10 MG PO TABS
10.0000 mg | ORAL_TABLET | Freq: Every day | ORAL | Status: DC
  Administered 2019-03-18 – 2019-03-19 (×2): 10 mg via ORAL
  Filled 2019-03-18 (×3): qty 1

## 2019-03-18 MED ORDER — ALBUTEROL SULFATE (2.5 MG/3ML) 0.083% IN NEBU: 2.5000 mg | INHALATION_SOLUTION | RESPIRATORY_TRACT | Status: DC | PRN

## 2019-03-18 MED ORDER — WARFARIN SODIUM 5 MG PO TABS
5.0000 mg | ORAL_TABLET | ORAL | Status: DC
  Administered 2019-03-18: 5 mg via ORAL
  Filled 2019-03-18 (×2): qty 1

## 2019-03-18 MED ORDER — LISINOPRIL 10 MG PO TABS
10.0000 mg | ORAL_TABLET | Freq: Every day | ORAL | Status: DC
  Administered 2019-03-18: 10 mg via ORAL
  Filled 2019-03-18: qty 1

## 2019-03-18 MED ORDER — SODIUM CHLORIDE 0.9 % IV SOLN
1.0000 g | Freq: Every day | INTRAVENOUS | Status: DC
  Administered 2019-03-18 – 2019-03-19 (×2): 1 g via INTRAVENOUS
  Filled 2019-03-18: qty 10
  Filled 2019-03-18: qty 1

## 2019-03-18 NOTE — Progress Notes (Addendum)
Patient was seen and examined.  Agree with Dr. Tinnie Gens H&P.  Troponins are negative acute MI ruled out and okay to discharge patient from cardiology standpoint.  Patient has been coughing and wheezing.  Chest x-ray with bronchitis patient is started on IV Rocephin and breathing treatments  Counseled patient to quit smoking for 5 minutes.  She verbalized understanding of the plan.  Agreeable with nicotine patch

## 2019-03-18 NOTE — ED Notes (Signed)
Patient transported to CT 

## 2019-03-18 NOTE — Consult Note (Signed)
[facility] Consultation Note  Patient ID: Mary Lutz, MRN: [id number], DOB/AGE: 03-Apr-1966 53 y.o. Admit date: 03/17/2019   Date of Consult: 03/18/2019 Primary Physician: Audie Pinto, PA-C Primary Cardiologist: None  Chief Complaint:  Chief Complaint  Patient presents with  . Chest Pain   Reason for Consult: Chest pain  HPI: 53 y.o. female is no known chronic obstructive pulmonary disease essential hypertension mixed hyperlipidemia and obesity who has had apparent previous valvular heart disease which is followed medically over the last many years.  The patient has done well recently and continues to try to lose weight with her significant obesity and has not had any recent issues of symptoms while doing activities around the house.  She all of a sudden at rest had left upper chest discomfort radiating into tingling arm discomfort and into the back associated with some diaphoresis and shortness of breath.  This lasted for several hours for which then the patient was seen in the emergency room.  At that time the patient had an EKG showing normal sinus rhythm and now subsequent troponin levels have been normal.  The patient has had resolution of chest discomfort although still a slight amount of discomfort still there at rest.  No evidence of shortness of breath or other hemodynamic compromise at this time.  Her hyper tension and COPD have been treated appropriately  Past Medical History:  Diagnosis Date  . CHF (congestive heart failure) ([place])   . COPD (chronic obstructive pulmonary disease) ([place])   . DVT (deep venous thrombosis) ([place]) 2010  . Hypertension   . MI (myocardial infarction) ([place])    x3; admissions here and [facility]. Patient reports valvular heart disease; [facility] records reference CAD as well      Surgical History:  Past Surgical History:  Procedure Laterality Date  . CARDIAC CATHETERIZATION     no stents     Home Meds: Prior to Admission  medications   Medication Sig Start Date End Date Taking? Authorizing Provider  albuterol (VENTOLIN HFA) 108 (90 Base) MCG/ACT inhaler Inhale 2 puffs into the lungs every 4 (four) hours as needed for wheezing or shortness of breath. 12/18/18  Yes [provider]  ANORO ELLIPTA 62.5-25 MCG/INH AEPB Inhale 1 puff into the lungs daily. 12/22/18  Yes [provider]  ipratropium-albuterol (DUONEB) 0.5-2.5 (3) MG/3ML SOLN Inhale 3 mLs into the lungs 4 (four) times daily as needed for wheezing or cough. 10/08/18  Yes [provider]  lisinopril (ZESTRIL) 10 MG tablet Take 10 mg by mouth at bedtime.  12/22/18  Yes [provider]  medroxyPROGESTERone (PROVERA) 10 MG tablet Take 10 mg by mouth at bedtime.  02/05/18  Yes [provider]  sertraline (ZOLOFT) 100 MG tablet Take 100 mg by mouth daily. 12/04/18  Yes [provider]  warfarin (COUMADIN) 5 MG tablet Take 5-7.5 mg by mouth at bedtime. Take 7.5mg  on Friday. Take 5mg  on all other days.   Yes [provider]    Inpatient Medications:  . docusate sodium  100 mg Oral BID  . lisinopril  10 mg Oral QHS  . medroxyPROGESTERone  10 mg Oral Daily  . sertraline  100 mg Oral Daily  . umeclidinium-vilanterol  1 puff Inhalation Daily  . [START ON 03/20/2019] warfarin  7.5 mg Oral Q Fri-1800   And  . warfarin  5 mg Oral Once per day on Sun Mon Tue Wed Thu Sat     Allergies:  Allergies  Allergen  Reactions  . Influenza Vaccines Swelling  . Morphine And Related Shortness Of Breath  . Pneumococcal Polysaccharide Vaccine Swelling  . Toradol [Ketorolac Tromethamine] Other (See Comments)    Hallucinations     Social History   Socioeconomic History  . Marital status: Married    Spouse name: Not on file  . Number of children: Not on file  . Years of education: Not on file  . Highest education level: Not on file  Occupational History  . Not on file  Social Needs  . Financial resource strain: Not  on file  . Food insecurity    Worry: Not on file    Inability: Not on file  . Transportation needs    Medical: Not on file    Non-medical: Not on file  Tobacco Use  . Smoking status: Current Some Day Smoker  . Smokeless tobacco: Never Used  Substance and Sexual Activity  . Alcohol use: No  . Drug use: No  . Sexual activity: Not Currently  Lifestyle  . Physical activity    Days per week: Not on file    Minutes per session: Not on file  . Stress: Not on file  Relationships  . Social Herbalist on phone: Not on file    Gets together: Not on file    Attends religious service: Not on file    Active member of club or organization: Not on file    Attends meetings of clubs or organizations: Not on file    Relationship status: Not on file  . Intimate partner violence    Fear of current or ex partner: Not on file    Emotionally abused: Not on file    Physically abused: Not on file    Forced sexual activity: Not on file  Other Topics Concern  . Not on file  Social History Narrative  . Not on file     Family History  Problem Relation Age of Onset  . Diabetes Mellitus II Mother   . Diabetes Mellitus II Father   . Heart failure Father   . Valvular heart disease Father   . Hypertension Sister      Review of Systems Positive for chest pain shortness of breath diaphoresis Negative for: General:  chills, fever, night sweats or weight changes.  Cardiovascular: PND orthopnea syncope dizziness  Dermatological skin lesions rashes Respiratory: Cough congestion Urologic: Frequent urination urination at night and hematuria Abdominal: negative for nausea, vomiting, diarrhea, bright red blood per rectum, melena, or hematemesis Neurologic: negative for visual changes, and/or hearing changes  All other systems reviewed and are otherwise negative except as noted above.  Labs: Recent Labs    03/17/19 2317 03/18/19 0540  TROPONINI <0.03 <0.03   Lab Results  Component  Value Date   WBC 12.8 (H) 03/17/2019   HGB 14.8 03/17/2019   HCT 44.2 03/17/2019   MCV 94.6 03/17/2019   PLT 365 03/17/2019    Recent Labs  Lab 03/17/19 2317  NA 140  K 3.7  CL 105  CO2 23  BUN 14  CREATININE 0.62  CALCIUM 8.5*  PROT 7.0  BILITOT 0.2*  ALKPHOS 68  ALT 15  AST 17  GLUCOSE 120*   No results found for: CHOL, HDL, LDLCALC, TRIG No results found for: DDIMER  Radiology/Studies:  Ct Angio Chest Pe W/cm &/or Wo Cm  Result Date: 03/18/2019 CLINICAL DATA:  53 y/o F; sudden onset chest pain and shortness of breath. History of pulmonary embolus.  EXAM: CT ANGIOGRAPHY CHEST WITH CONTRAST TECHNIQUE: Multidetector CT imaging of the chest was performed using the standard protocol during bolus administration of intravenous contrast. Multiplanar CT image reconstructions and MIPs were obtained to evaluate the vascular anatomy. CONTRAST:  167mL ISOVUE-370 IOPAMIDOL (ISOVUE-370) INJECTION 76% COMPARISON:  12/22/2018 CTA of the chest. FINDINGS: Cardiovascular: Satisfactory opacification of the pulmonary arteries to the segmental level. No evidence of pulmonary embolism. Normal heart size. No pericardial effusion. Mild aortic calcific atherosclerosis. Mediastinum/Nodes: No enlarged mediastinal, hilar, or axillary lymph nodes. Thyroid gland, trachea, and esophagus demonstrate no significant findings. Lungs/Pleura: Stable 5 mm left lower lobe small peripheral pulmonary nodule compatible benign etiology (series 6, image 68). Otherwise lungs are clear. No pleural effusion or pneumothorax. Upper Abdomen: No acute abnormality. Musculoskeletal: No chest wall abnormality. No acute or significant osseous findings. Review of the MIP images confirms the above findings. IMPRESSION: 1. No pulmonary embolus identified. 2. Mild aortic calcific atherosclerosis. 3. Otherwise unremarkable CTA of the chest. Electronically Signed   By: Kristine Garbe M.D.   On: 03/18/2019 00:38   Dg Chest Port 1  View  Result Date: 03/17/2019 CLINICAL DATA:  Sudden onset of chest pain. EXAM: PORTABLE CHEST 1 VIEW COMPARISON:  Radiograph and CT 12/22/2018 FINDINGS: Heart is normal in size. Normal mediastinal contours. Development of peribronchial and interstitial thickening. No focal airspace disease. No pleural effusion or pneumothorax. Degenerative change of both acromioclavicular joints. IMPRESSION: Development of peribronchial and interstitial thickening, may be bronchitis, pulmonary edema felt less likely given normal heart size. Electronically Signed   By: Keith Rake M.D.   On: 03/17/2019 23:31    EKG: Normal sinus rhythm  Weights: Filed Weights   03/17/19 2301 03/18/19 0306  Weight: 134.7 kg (!) 152.8 kg     Physical Exam: Blood pressure (!) 115/55, pulse 84, temperature 98.4 F (36.9 C), temperature source Oral, resp. rate 19, height 5\' 7"  (1.702 m), weight (!) 152.8 kg, SpO2 97 %. Body mass index is 52.75 kg/m. General: Well developed, well nourished, in no acute distress. Head eyes ears nose throat: Normocephalic, atraumatic, sclera non-icteric, no xanthomas, nares are without discharge. No apparent thyromegaly and/or mass  Lungs: Normal respiratory effort.  Few use wheezes, no rales, no rhonchi.  Heart: RRR with normal S1 S2.  2+ right upper sternal border murmur gallop, no rub, PMI is normal size and placement, carotid upstroke normal without bruit, jugular venous pressure is normal Abdomen: Soft, non-tender, non-distended with normoactive bowel sounds. No hepatomegaly. No rebound/guarding. No obvious abdominal masses. Abdominal aorta is normal size without bruit Extremities: Trace edema. no cyanosis, no clubbing, no ulcers  Peripheral : 2+ bilateral upper extremity pulses, 2+ bilateral femoral pulses, 2+ bilateral dorsal pedal pulse Neuro: Alert and oriented. No facial asymmetry. No focal deficit. Moves all extremities spontaneously. Musculoskeletal: Normal muscle tone without  kyphosis Psych:  Responds to questions appropriately with a normal affect.    Assessment: 53 year old female with apparent previous heart valvular issues with hypertension hyperlipidemia and chronic obstructive pulmonary disease with atypical chest discomfort with normal EKG and normal troponin and no current evidence of heart failure or myocardial infarction  Plan: 1.  Continue supportive care of atypical chest discomfort without evidence of congestive heart failure or myocardial infarction 2.  Continue ambulation and follow-up for improvements of symptoms and for a cause 3.  No change in hypertension with current medical regimen 4.  If ambulating well with no further symptoms okay for discharged home from the cardiac standpoint with follow-up and  other diagnostic testing including echocardiogram for valvular heart disease and possible stress test if patient has recurrent evidence of chest discomfort  Signed, Corey Skains M.D. [facility] 03/18/2019, 8:14 AM

## 2019-03-18 NOTE — H&P (Signed)
Mary Lutz is an 53 y.o. female.   Chief Complaint: Chest pain HPI: The patient with past medical history of myocardial infarction, COPD and valvular heart disease presents to the emergency department complaining of chest pain.  The patient reports that her pain began while at rest she was sitting in her chair surfing the Internet.  She recalls feeling diaphoretic and been very cold.  She also had a tingling sensation running up the left side of her neck.  She eventually called EMS who administered aspirin and applied Nitropaste at which time the patient remembers feeling mildly nauseous.  She admits to varying degrees of shortness of breath throughout this episode.  Initial EKG and troponin were negative for signs of ischemia but due to ongoing chest pain and cardiac risk factors the emergency department staff called the hospitalist service for admission.  Past Medical History:  Diagnosis Date  . CHF (congestive heart failure) ([place])   . COPD (chronic obstructive pulmonary disease) ([place])   . DVT (deep venous thrombosis) ([place]) 2010  . Hypertension   . MI (myocardial infarction) ([place])    x3; admissions here and [facility]. Patient reports valvular heart disease; [facility] records reference CAD as well    Past Surgical History:  Procedure Laterality Date  . CARDIAC CATHETERIZATION     no stents    Family History  Problem Relation Age of Onset  . Diabetes Mellitus II Mother   . Diabetes Mellitus II Father   . Heart failure Father   . Valvular heart disease Father   . Hypertension Sister    Social History:  reports that she has been smoking. She has never used smokeless tobacco. She reports that she does not drink alcohol or use drugs.  Allergies:  Allergies  Allergen Reactions  . Influenza Vaccines Swelling  . Morphine And Related Shortness Of Breath  . Pneumococcal Polysaccharide Vaccine Swelling  . Toradol [Ketorolac Tromethamine] Other (See Comments)    Hallucinations      Medications Prior to Admission  Medication Sig Dispense Refill  . albuterol (VENTOLIN HFA) 108 (90 Base) MCG/ACT inhaler Inhale 2 puffs into the lungs every 4 (four) hours as needed for wheezing or shortness of breath.    Jearl Klinefelter ELLIPTA 62.5-25 MCG/INH AEPB Inhale 1 puff into the lungs daily.    Marland Kitchen ipratropium-albuterol (DUONEB) 0.5-2.5 (3) MG/3ML SOLN Inhale 3 mLs into the lungs 4 (four) times daily as needed for wheezing or cough.    Marland Kitchen lisinopril (ZESTRIL) 10 MG tablet Take 10 mg by mouth at bedtime.     . medroxyPROGESTERone (PROVERA) 10 MG tablet Take 10 mg by mouth at bedtime.   11  . sertraline (ZOLOFT) 100 MG tablet Take 100 mg by mouth daily.    Marland Kitchen warfarin (COUMADIN) 5 MG tablet Take 5-7.5 mg by mouth at bedtime. Take 7.5mg  on Friday. Take 5mg  on all other days.  3    Results for orders placed or performed during the hospital encounter of 03/17/19 (from the past 48 hour(s))  CBC with Differential     Status: Abnormal   Collection Time: 03/17/19 11:17 PM  Result Value Ref Range   WBC 12.8 (H) 4.0 - 10.5 K/uL   RBC 4.67 3.87 - 5.11 MIL/uL   Hemoglobin 14.8 12.0 - 15.0 g/dL   HCT 44.2 36.0 - 46.0 %   MCV 94.6 80.0 - 100.0 fL   MCH 31.7 26.0 - 34.0 pg   MCHC 33.5 30.0 - 36.0 g/dL   RDW 13.1  11.5 - 15.5 %   Platelets 365 150 - 400 K/uL   nRBC 0.0 0.0 - 0.2 %   Neutrophils Relative % 58 %   Neutro Abs 7.5 1.7 - 7.7 K/uL   Lymphocytes Relative 27 %   Lymphs Abs 3.5 0.7 - 4.0 K/uL   Monocytes Relative 12 %   Monocytes Absolute 1.6 (H) 0.1 - 1.0 K/uL   Eosinophils Relative 2 %   Eosinophils Absolute 0.2 0.0 - 0.5 K/uL   Basophils Relative 1 %   Basophils Absolute 0.1 0.0 - 0.1 K/uL   Immature Granulocytes 0 %   Abs Immature Granulocytes 0.03 0.00 - 0.07 K/uL    Comment: Performed at [facility], [address] [place], [place] [address]  Comprehensive metabolic panel     Status: Abnormal   Collection Time: 03/17/19 11:17 PM  Result Value Ref Range   Sodium  140 135 - 145 mmol/L   Potassium 3.7 3.5 - 5.1 mmol/L   Chloride 105 98 - 111 mmol/L   CO2 23 22 - 32 mmol/L   Glucose, Bld 120 (H) 70 - 99 mg/dL   BUN 14 6 - 20 mg/dL   Creatinine, Ser 0.62 0.44 - 1.00 mg/dL   Calcium 8.5 (L) 8.9 - 10.3 mg/dL   Total Protein 7.0 6.5 - 8.1 g/dL   Albumin 3.7 3.5 - 5.0 g/dL   AST 17 15 - 41 U/L   ALT 15 0 - 44 U/L   Alkaline Phosphatase 68 38 - 126 U/L   Total Bilirubin 0.2 (L) 0.3 - 1.2 mg/dL   GFR calc non Af Amer >60 >60 mL/min   GFR calc Af Amer >60 >60 mL/min   Anion gap 12 5 - 15    Comment: Performed at [facility], [address] [place], [place] [address]  Brain natriuretic peptide     Status: None   Collection Time: 03/17/19 11:17 PM  Result Value Ref Range   B Natriuretic Peptide 44.0 0.0 - 100.0 pg/mL    Comment: Performed at [facility], [address] [place], [place] [address]  Troponin I - Once     Status: None   Collection Time: 03/17/19 11:17 PM  Result Value Ref Range   Troponin I <0.03 <0.03 ng/mL    Comment: Performed at [facility], [address] [place], [place] [address]  Protime-INR     Status: Abnormal   Collection Time: 03/17/19 11:17 PM  Result Value Ref Range   Prothrombin Time 22.8 (H) 11.4 - 15.2 seconds   INR 2.0 (H) 0.8 - 1.2    Comment: (NOTE) INR goal varies based on device and disease states. Performed at [facility], [address] [place], [place] [address]   SARS Coronavirus 2 (CEPHEID- Performed in [facility] hospital lab), Hosp Order     Status: None   Collection Time: 03/17/19 11:58 PM   Specimen: Nasopharyngeal Swab  Result Value Ref Range   SARS Coronavirus 2 NEGATIVE NEGATIVE    Comment: (NOTE) If result is NEGATIVE SARS-CoV-2 target nucleic acids are NOT DETECTED. The SARS-CoV-2 RNA is generally detectable in upper and lower  respiratory specimens during the acute phase of infection. The lowest  concentration of SARS-CoV-2 viral copies this  assay can detect is 250  copies / mL. A negative result does not preclude SARS-CoV-2 infection  and should not be used as the sole basis for treatment or other  patient management decisions.  A negative  result may occur with  improper specimen collection / handling, submission of specimen other  than nasopharyngeal swab, presence of viral mutation(s) within the  areas targeted by this assay, and inadequate number of viral copies  (<250 copies / mL). A negative result must be combined with clinical  observations, patient history, and epidemiological information. If result is POSITIVE SARS-CoV-2 target nucleic acids are DETECTED. The SARS-CoV-2 RNA is generally detectable in upper and lower  respiratory specimens dur ing the acute phase of infection.  Positive  results are indicative of active infection with SARS-CoV-2.  Clinical  correlation with patient history and other diagnostic information is  necessary to determine patient infection status.  Positive results do  not rule out bacterial infection or co-infection with other viruses. If result is PRESUMPTIVE POSTIVE SARS-CoV-2 nucleic acids MAY BE PRESENT.   A presumptive positive result was obtained on the submitted specimen  and confirmed on repeat testing.  While 2019 novel coronavirus  (SARS-CoV-2) nucleic acids may be present in the submitted sample  additional confirmatory testing may be necessary for epidemiological  and / or clinical management purposes  to differentiate between  SARS-CoV-2 and other Sarbecovirus currently known to infect humans.  If clinically indicated additional testing with an alternate test  methodology [phone]) is advised. The SARS-CoV-2 RNA is generally  detectable in upper and lower respiratory sp ecimens during the acute  phase of infection. The expected result is Negative. Fact Sheet for Patients:  StrictlyIdeas.no Fact Sheet for Healthcare  Providers: BankingDealers.co.za This test is not yet approved or cleared by the [place] FDA and has been authorized for detection and/or diagnosis of SARS-CoV-2 by FDA under an Emergency Use Authorization (EUA).  This EUA will remain in effect (meaning this test can be used) for the duration of the COVID-19 declaration under Section 564(b)(1) of the Act, 21 U.S.C. section 360bbb-3(b)(1), unless the authorization is terminated or revoked sooner. Performed at [facility], [place], [place] [address]    Ct Angio Chest Pe W/cm &/or Wo Cm  Result Date: 03/18/2019 CLINICAL DATA:  53 y/o F; sudden onset chest pain and shortness of breath. History of pulmonary embolus. EXAM: CT ANGIOGRAPHY CHEST WITH CONTRAST TECHNIQUE: Multidetector CT imaging of the chest was performed using the standard protocol during bolus administration of intravenous contrast. Multiplanar CT image reconstructions and MIPs were obtained to evaluate the vascular anatomy. CONTRAST:  126mL ISOVUE-370 IOPAMIDOL (ISOVUE-370) INJECTION 76% COMPARISON:  12/22/2018 CTA of the chest. FINDINGS: Cardiovascular: Satisfactory opacification of the pulmonary arteries to the segmental level. No evidence of pulmonary embolism. Normal heart size. No pericardial effusion. Mild aortic calcific atherosclerosis. Mediastinum/Nodes: No enlarged mediastinal, hilar, or axillary lymph nodes. Thyroid gland, trachea, and esophagus demonstrate no significant findings. Lungs/Pleura: Stable 5 mm left lower lobe small peripheral pulmonary nodule compatible benign etiology (series 6, image 68). Otherwise lungs are clear. No pleural effusion or pneumothorax. Upper Abdomen: No acute abnormality. Musculoskeletal: No chest wall abnormality. No acute or significant osseous findings. Review of the MIP images confirms the above findings. IMPRESSION: 1. No pulmonary embolus identified. 2. Mild aortic calcific atherosclerosis.  3. Otherwise unremarkable CTA of the chest. Electronically Signed   By: Kristine Garbe M.D.   On: 03/18/2019 00:38   Dg Chest Port 1 View  Result Date: 03/17/2019 CLINICAL DATA:  Sudden onset of chest pain. EXAM: PORTABLE CHEST 1 VIEW COMPARISON:  Radiograph and CT 12/22/2018 FINDINGS: Heart is normal in size. Normal mediastinal contours. Development of peribronchial and  interstitial thickening. No focal airspace disease. No pleural effusion or pneumothorax. Degenerative change of both acromioclavicular joints. IMPRESSION: Development of peribronchial and interstitial thickening, may be bronchitis, pulmonary edema felt less likely given normal heart size. Electronically Signed   By: Keith Rake M.D.   On: 03/17/2019 23:31    Review of Systems  Constitutional: Positive for diaphoresis. Negative for chills and fever.  HENT: Negative for sore throat and tinnitus.   Eyes: Negative for blurred vision and redness.  Respiratory: Negative for cough and shortness of breath.   Cardiovascular: Positive for chest pain. Negative for palpitations, orthopnea and PND.  Gastrointestinal: Negative for abdominal pain, diarrhea, nausea and vomiting.  Genitourinary: Negative for dysuria, frequency and urgency.  Musculoskeletal: Negative for joint pain and myalgias.  Skin: Negative for rash.       No lesions  Neurological: Negative for speech change, focal weakness and weakness.  Endo/Heme/Allergies: Does not bruise/bleed easily.       No temperature intolerance  Psychiatric/Behavioral: Negative for depression and suicidal ideas.    Blood pressure (!) 138/92, pulse 79, temperature 98.4 F (36.9 C), temperature source Oral, resp. rate 20, height 5\' 7"  (1.702 m), weight (!) 152.8 kg, SpO2 97 %. Physical Exam  Vitals reviewed. Constitutional: She is oriented to person, place, and time. She appears well-developed and well-nourished. No distress.  HENT:  Head: Normocephalic and atraumatic.   Mouth/Throat: Oropharynx is clear and moist.  Eyes: Pupils are equal, round, and reactive to light. Conjunctivae and EOM are normal. No scleral icterus.  Neck: Normal range of motion. Neck supple. No JVD present. No tracheal deviation present. No thyromegaly present.  Cardiovascular: Normal rate, regular rhythm and normal heart sounds. Exam reveals no gallop and no friction rub.  No murmur heard. Respiratory: Effort normal and breath sounds normal.  GI: Soft. Bowel sounds are normal. She exhibits no distension. There is no abdominal tenderness.  Genitourinary:    Genitourinary Comments: Deferred   Musculoskeletal: Normal range of motion.        General: No edema.  Lymphadenopathy:    She has no cervical adenopathy.  Neurological: She is alert and oriented to person, place, and time. No cranial nerve deficit. She exhibits normal muscle tone.  Skin: Skin is warm and dry. No rash noted. No erythema.  Psychiatric: She has a normal mood and affect. Her behavior is normal. Judgment and thought content normal.     Assessment/Plan This is a 53 year old female admitted for chest pain. 1.  Chest pain: Atypical and intermittently present.  The patient has a history of myocardial infarction x3 all at a young age.  The patient states that each event was precipitated by valvular heart disease and/or arrhythmia.  There are records in the old hospital EMR of hospitalization for chest pain but I do not see the cath report or echocardiogram.  An admission note in the old system reports that some of this work-up was done at [facility].  Consult cardiology for further guidance.  Follow cardiac enzymes and monitor telemetry. 2.  Hypertension: Controlled; continue lisinopril 3.  COPD: Continue long-acting bronchial agonist with anticholinergic agent.  Albuterol as needed. 4.  Morbid obesity: BMI is 52.7; encouraged healthy diet and exercise 5.  Depression: Continue Zoloft 6.  DVT prophylaxis: Warfarin (due to history  of PE) 7.  GI prophylaxis: None The patient is a full code.  Time spent on admission orders and patient care approximately 45 minutes  Harrie Foreman, MD 03/18/2019, 4:52 AM

## 2019-03-18 NOTE — Progress Notes (Signed)
ANTICOAGULATION CONSULT NOTE - Initial Consult  Pharmacy Consult for Warfarin Dosing Indication: h/o PE  Allergies  Allergen Reactions  . Influenza Vaccines Swelling  . Morphine And Related Shortness Of Breath  . Pneumococcal Polysaccharide Vaccine Swelling  . Toradol [Ketorolac Tromethamine] Other (See Comments)    Hallucinations     Patient Measurements: Height: 5\' 7"  (170.2 cm) Weight: (!) 336 lb 12.8 oz (152.8 kg) IBW/kg (Calculated) : 61.6 Heparin Dosing Weight:    Vital Signs: Temp: 98.4 F (36.9 C) (06/17 0733) Temp Source: Oral (06/17 0733) BP: 115/55 (06/17 0733) Pulse Rate: 84 (06/17 0733)  Labs: Recent Labs    03/17/19 2317 03/18/19 0540 03/18/19 0858  HGB 14.8  --   --   HCT 44.2  --   --   PLT 365  --   --   LABPROT 22.8*  --   --   INR 2.0*  --   --   CREATININE 0.62  --   --   TROPONINI <0.03 <0.03 <0.03    Estimated Creatinine Clearance: 128.8 mL/min (by C-G formula based on SCr of 0.62 mg/dL).   Medical History: Past Medical History:  Diagnosis Date  . CHF (congestive heart failure) ([place])   . COPD (chronic obstructive pulmonary disease) ([place])   . DVT (deep venous thrombosis) ([place]) 2010  . Hypertension   . MI (myocardial infarction) ([place])    x3; admissions here and [facility]. Patient reports valvular heart disease; [facility] records reference CAD as well    Medications:  Home Warfarin Regimen: 5mg  daily except 7.5mg  on Friday  Assessment: Patient is a 53yo female admitted for chest pain. Pharmacy consulted to manage warfarin. INR on admission is 2.0.  6/16 INR 2.0  Goal of Therapy:  INR 2-3 Monitor platelets by anticoagulation protocol: Yes   Plan:  Will continue with home dose of Warfarin 5mg  daily except for 7.5mg  on Friday. Will check daily INR as patient has been started on Ceftriaxone.   Paulina Fusi, PharmD, BCPS 03/18/2019 2:05 PM

## 2019-03-18 NOTE — Progress Notes (Signed)
Family Meeting Note  [facility] Directive:yes  Today a meeting took place with the Patient.    The following clinical team members were present during this meeting:MD  The following were discussed:Patient's diagnosis: Chest pain, acute bronchitis, tobacco abuse disorder, history of DVT, treatment plan of care discussed in detail with the patient.  She verbalized understanding of the plan   patient's progosis: Unable to determine and Goals for treatment: Full Code  Husband healthcare power of attorney  Additional follow-up to be provided: Hospitalist and cardiology  Time spent during discussion:35min  Nicholes Mango, MD

## 2019-03-18 NOTE — Plan of Care (Signed)
  Problem: Activity: Goal: Risk for activity intolerance will decrease Outcome: Progressing Note: Up with standby assist, tolerating well   Problem: Nutrition: Goal: Adequate nutrition will be maintained Outcome: Progressing   Problem: Coping: Goal: Level of anxiety will decrease Outcome: Progressing   Problem: Pain Managment: Goal: General experience of comfort will improve Outcome: Progressing Note: Treated for some chest pain and left arm numbness with tylenol once, with relief   Problem: Safety: Goal: Ability to remain free from injury will improve Outcome: Progressing   Problem: Skin Integrity: Goal: Risk for impaired skin integrity will decrease Outcome: Progressing   Problem: Education: Goal: Knowledge of General Education information will improve Description: Including pain rating scale, medication(s)/side effects and non-pharmacologic comfort measures Outcome: Completed/Met

## 2019-03-19 LAB — PROTIME-INR
INR: 1.9 — ABNORMAL HIGH (ref 0.8–1.2)
Prothrombin Time: 21.8 seconds — ABNORMAL HIGH (ref 11.4–15.2)

## 2019-03-19 MED ORDER — CEFDINIR 300 MG PO CAPS
300.0000 mg | ORAL_CAPSULE | Freq: Two times a day (BID) | ORAL | Status: DC
  Filled 2019-03-19 (×2): qty 1

## 2019-03-19 MED ORDER — PREDNISONE 10 MG (21) PO TBPK: 10.0000 mg | ORAL_TABLET | Freq: Every day | ORAL | 0 refills | Status: DC

## 2019-03-19 MED ORDER — NICOTINE 14 MG/24HR TD PT24: 14.0000 mg | MEDICATED_PATCH | Freq: Every day | TRANSDERMAL | 0 refills | Status: AC

## 2019-03-19 MED ORDER — ACETAMINOPHEN 325 MG PO TABS: 650.0000 mg | ORAL_TABLET | Freq: Four times a day (QID) | ORAL | Status: AC | PRN

## 2019-03-19 MED ORDER — CEFDINIR 300 MG PO CAPS: 300.0000 mg | ORAL_CAPSULE | Freq: Two times a day (BID) | ORAL | 0 refills | Status: AC

## 2019-03-19 NOTE — Discharge Summary (Signed)
[facility] [place] NAME: Mary Lutz    MR#:  [id number]  DATE OF BIRTH:  [birth year]  DATE OF ADMISSION:  03/17/2019 ADMITTING PHYSICIAN: Harrie Foreman, MD  DATE OF DISCHARGE:  03/19/19   PRIMARY CARE PHYSICIAN: Audie Pinto, PA-C    ADMISSION DIAGNOSIS:  Unstable angina ([facility]) [I20.0] Chest pain, unspecified type [R07.9]  DISCHARGE DIAGNOSIS:  Acute bronchitis Tobacco abuse disorder SECONDARY DIAGNOSIS:   Past Medical History:  Diagnosis Date  . CHF (congestive heart failure) ([place])   . COPD (chronic obstructive pulmonary disease) ([place])   . DVT (deep venous thrombosis) ([place]) 2010  . Hypertension   . MI (myocardial infarction) ([place])    x3; admissions here and [facility]. Patient reports valvular heart disease; [facility] records reference CAD as well    HOSPITAL COURSE:  HPI: The patient with past medical history of myocardial infarction, COPD and valvular heart disease presents to the emergency department complaining of chest pain.  The patient reports that her pain began while at rest she was sitting in her chair surfing the Internet.  She recalls feeling diaphoretic and been very cold.  She also had a tingling sensation running up the left side of her neck.  She eventually called EMS who administered aspirin and applied Nitropaste at which time the patient remembers feeling mildly nauseous.  She admits to varying degrees of shortness of breath throughout this episode.  Initial EKG and troponin were negative for signs of ischemia but due to ongoing chest pain and cardiac risk factors the emergency department staff called the hospitalist service for admission.  Hospital course   1.    Atypical chest pain from acute bronchitis underlying COPD Acute MI ruled out with negative troponins.  Seen by cardiology  KG with normal sinus rhythm.  Okay to discharge patient from cardiology standpoint and recommending outpatient  cardiology follow-up and outpatient echocardiogram and possible stress test Patient is given IV Rocephin and discharged home with Omnicef and prednisone Continue home dose inhalers and nebulizer treatments Reinforced the importance of quitting smoking 2.  Hypertension: Controlled; continue lisinopril 3.  COPD: Continue long-acting bronchial agonist with anticholinergic agent.  Albuterol as needed. 4.  Morbid obesity: BMI is 52.7; encouraged healthy diet and exercise 5.  Depression: Continue Zoloft 6.  History of pulmonary embolism on Coumadin INR is 1.9 continue Coumadin at 5 mg daily except for 7.5 mg on Friday and PCP to repeat in INR in 2 days  Tobacco abuse disorder counseled patient to quit smoking for 5 minutes.  She verbalized understanding of the plan.  She is requesting nicotine patch and prescriptions to go home  .  DVT prophylaxis: Warfarin (due to history of PE)   GI prophylaxis: None   DISCHARGE CONDITIONS:   stable  CONSULTS OBTAINED:     PROCEDURES  None   DRUG ALLERGIES:   Allergies  Allergen Reactions  . Influenza Vaccines Swelling  . Morphine And Related Shortness Of Breath  . Pneumococcal Polysaccharide Vaccine Swelling  . Toradol [Ketorolac Tromethamine] Other (See Comments)    Hallucinations     DISCHARGE MEDICATIONS:   Allergies as of 03/19/2019      Reactions   Influenza Vaccines Swelling   Morphine And Related Shortness Of Breath   Pneumococcal Polysaccharide Vaccine Swelling   Toradol [ketorolac Tromethamine] Other (See Comments)   Hallucinations      Medication List    TAKE these medications   acetaminophen 325 MG tablet  Commonly known as: TYLENOL Take 2 tablets (650 mg total) by mouth every 6 (six) hours as needed for mild pain (or Fever >/= 101).   albuterol 108 (90 Base) MCG/ACT inhaler Commonly known as: VENTOLIN HFA Inhale 2 puffs into the lungs every 4 (four) hours as needed for wheezing or shortness of breath.   Anoro  Ellipta 62.5-25 MCG/INH Aepb Generic drug: umeclidinium-vilanterol Inhale 1 puff into the lungs daily.   cefdinir 300 MG capsule Commonly known as: OMNICEF Take 1 capsule (300 mg total) by mouth every 12 (twelve) hours for 10 doses.   ipratropium-albuterol 0.5-2.5 (3) MG/3ML Soln Commonly known as: DUONEB Inhale 3 mLs into the lungs 4 (four) times daily as needed for wheezing or cough.   lisinopril 10 MG tablet Commonly known as: ZESTRIL Take 10 mg by mouth at bedtime.   medroxyPROGESTERone 10 MG tablet Commonly known as: PROVERA Take 10 mg by mouth at bedtime.   nicotine 14 mg/24hr patch Commonly known as: NICODERM CQ - dosed in mg/24 hours Place 1 patch (14 mg total) onto the skin daily. Start taking on: March 20, 2019   predniSONE 10 MG (21) Tbpk tablet Commonly known as: STERAPRED UNI-PAK 21 TAB Take 1 tablet (10 mg total) by mouth daily. Take 6 tablets by mouth for 1 day followed by  5 tablets by mouth for 1 day followed by  4 tablets by mouth for 1 day followed by  3 tablets by mouth for 1 day followed by  2 tablets by mouth for 1 day followed by  1 tablet by mouth for a day and stop   sertraline 100 MG tablet Commonly known as: ZOLOFT Take 100 mg by mouth daily.   warfarin 5 MG tablet Commonly known as: COUMADIN Take 5-7.5 mg by mouth at bedtime. Take 7.5mg  on Friday. Take 5mg  on all other days.        DISCHARGE INSTRUCTIONS:   Follow-up with primary care physician in 2 to 3 days.  Repeat PT/INR in 2 days for further management of Coumadin by primary care physician Follow-up with cardiology Dr. Nehemiah Massed in a week  DIET:  Cardiac diet  DISCHARGE CONDITION:  Stable  ACTIVITY:  Activity as tolerated  OXYGEN:  Home Oxygen: No.   Oxygen Delivery: room air  DISCHARGE LOCATION:  home   If you experience worsening of your admission symptoms, develop shortness of breath, life threatening emergency, suicidal or homicidal thoughts you must seek medical  attention immediately by calling 911 or calling your MD immediately  if symptoms less severe.  You Must read complete instructions/literature along with all the possible adverse reactions/side effects for all the Medicines you take and that have been prescribed to you. Take any new Medicines after you have completely understood and accpet all the possible adverse reactions/side effects.   Please note  You were cared for by a hospitalist during your hospital stay. If you have any questions about your discharge medications or the care you received while you were in the hospital after you are discharged, you can call the unit and asked to speak with the hospitalist on call if the hospitalist that took care of you is not available. Once you are discharged, your primary care physician will handle any further medical issues. Please note that NO REFILLS for any discharge medications will be authorized once you are discharged, as it is imperative that you return to your primary care physician (or establish a relationship with a primary care physician if you do  not have one) for your aftercare needs so that they can reassess your need for medications and monitor your lab values.     Today  Chief Complaint  Patient presents with  . Chest Pain   Patient is feeling much better.  He reports chest pain while coughing only.  Denies any shortness of breath and wants to go home.  ROS:  CONSTITUTIONAL: Denies fevers, chills. Denies any fatigue, weakness.  EYES: Denies blurry vision, double vision, eye pain. EARS, NOSE, THROAT: Denies tinnitus, ear pain, hearing loss. RESPIRATORY: Denies cough, wheeze, shortness of breath.  CARDIOVASCULAR: Denies chest pain, palpitations, edema.  GASTROINTESTINAL: Denies nausea, vomiting, diarrhea, abdominal pain. Denies bright red blood per rectum. GENITOURINARY: Denies dysuria, hematuria. ENDOCRINE: Denies nocturia or thyroid problems. HEMATOLOGIC AND LYMPHATIC: Denies  easy bruising or bleeding. SKIN: Denies rash or lesion. MUSCULOSKELETAL: Denies pain in neck, back, shoulder, knees, hips or arthritic symptoms.  NEUROLOGIC: Denies paralysis, paresthesias.  PSYCHIATRIC: Denies anxiety or depressive symptoms.   VITAL SIGNS:  Blood pressure 109/64, pulse 76, temperature 98.4 F (36.9 C), temperature source Oral, resp. rate 19, height 5\' 7"  (1.702 m), weight (!) 152.3 kg, SpO2 97 %.  I/O:    Intake/Output Summary (Last 24 hours) at 03/19/2019 1303 Last data filed at 03/19/2019 0215 Gross per 24 hour  Intake 960 ml  Output 0 ml  Net 960 ml    PHYSICAL EXAMINATION:  GENERAL:  53 y.o.-year-old patient lying in the bed with no acute distress.  EYES: Pupils equal, round, reactive to light and accommodation. No scleral icterus. Extraocular muscles intact.  HEENT: Head atraumatic, normocephalic. Oropharynx and nasopharynx clear.  NECK:  Supple, no jugular venous distention. No thyroid enlargement, no tenderness.  LUNGS: Moderate bronchial breath sounds bilaterally, no wheezing, rales,rhonchi or crepitation. No use of accessory muscles of respiration.  CARDIOVASCULAR: S1, S2 normal. No murmurs, rubs, or gallops.  ABDOMEN: Soft, non-tender, non-distended. Bowel sounds present. EXTREMITIES: No pedal edema, cyanosis, or clubbing.  NEUROLOGIC: Cranial nerves II through XII are intact. Muscle strength 5/5 in all extremities. Sensation intact. Gait not checked.  PSYCHIATRIC: The patient is alert and oriented x 3.  SKIN: No obvious rash, lesion, or ulcer.   DATA REVIEW:   CBC Recent Labs  Lab 03/17/19 2317  WBC 12.8*  HGB 14.8  HCT 44.2  PLT 365    Chemistries  Recent Labs  Lab 03/17/19 2317  NA 140  K 3.7  CL 105  CO2 23  GLUCOSE 120*  BUN 14  CREATININE 0.62  CALCIUM 8.5*  AST 17  ALT 15  ALKPHOS 68  BILITOT 0.2*    Cardiac Enzymes Recent Labs  Lab 03/18/19 1446  TROPONINI <0.03    Microbiology Results  Results for orders  placed or performed during the hospital encounter of 03/17/19  SARS Coronavirus 2 (CEPHEID- Performed in [place] hospital lab), Hosp Order     Status: None   Collection Time: 03/17/19 11:58 PM   Specimen: Nasopharyngeal Swab  Result Value Ref Range Status   SARS Coronavirus 2 NEGATIVE NEGATIVE Final    Comment: (NOTE) If result is NEGATIVE SARS-CoV-2 target nucleic acids are NOT DETECTED. The SARS-CoV-2 RNA is generally detectable in upper and lower  respiratory specimens during the acute phase of infection. The lowest  concentration of SARS-CoV-2 viral copies this assay can detect is 250  copies / mL. A negative result does not preclude SARS-CoV-2 infection  and should not be used as the sole basis for treatment or other  patient management decisions.  A negative result may occur with  improper specimen collection / handling, submission of specimen other  than nasopharyngeal swab, presence of viral mutation(s) within the  areas targeted by this assay, and inadequate number of viral copies  (<250 copies / mL). A negative result must be combined with clinical  observations, patient history, and epidemiological information. If result is POSITIVE SARS-CoV-2 target nucleic acids are DETECTED. The SARS-CoV-2 RNA is generally detectable in upper and lower  respiratory specimens dur ing the acute phase of infection.  Positive  results are indicative of active infection with SARS-CoV-2.  Clinical  correlation with patient history and other diagnostic information is  necessary to determine patient infection status.  Positive results do  not rule out bacterial infection or co-infection with other viruses. If result is PRESUMPTIVE POSTIVE SARS-CoV-2 nucleic acids MAY BE PRESENT.   A presumptive positive result was obtained on the submitted specimen  and confirmed on repeat testing.  While 2019 novel coronavirus  (SARS-CoV-2) nucleic acids may be present in the submitted sample  additional  confirmatory testing may be necessary for epidemiological  and / or clinical management purposes  to differentiate between  SARS-CoV-2 and other Sarbecovirus currently known to infect humans.  If clinically indicated additional testing with an alternate test  methodology [phone]) is advised. The SARS-CoV-2 RNA is generally  detectable in upper and lower respiratory sp ecimens during the acute  phase of infection. The expected result is Negative. Fact Sheet for Patients:  StrictlyIdeas.no Fact Sheet for Healthcare Providers: BankingDealers.co.za This test is not yet approved or cleared by the [place] FDA and has been authorized for detection and/or diagnosis of SARS-CoV-2 by FDA under an Emergency Use Authorization (EUA).  This EUA will remain in effect (meaning this test can be used) for the duration of the COVID-19 declaration under Section 564(b)(1) of the Act, 21 U.S.C. section 360bbb-3(b)(1), unless the authorization is terminated or revoked sooner. Performed at [facility], [place], [place] [address]     RADIOLOGY:  Ct Angio Chest Pe W/cm &/or Wo Cm  Result Date: 03/18/2019 CLINICAL DATA:  53 y/o F; sudden onset chest pain and shortness of breath. History of pulmonary embolus. EXAM: CT ANGIOGRAPHY CHEST WITH CONTRAST TECHNIQUE: Multidetector CT imaging of the chest was performed using the standard protocol during bolus administration of intravenous contrast. Multiplanar CT image reconstructions and MIPs were obtained to evaluate the vascular anatomy. CONTRAST:  135mL ISOVUE-370 IOPAMIDOL (ISOVUE-370) INJECTION 76% COMPARISON:  12/22/2018 CTA of the chest. FINDINGS: Cardiovascular: Satisfactory opacification of the pulmonary arteries to the segmental level. No evidence of pulmonary embolism. Normal heart size. No pericardial effusion. Mild aortic calcific atherosclerosis. Mediastinum/Nodes: No enlarged  mediastinal, hilar, or axillary lymph nodes. Thyroid gland, trachea, and esophagus demonstrate no significant findings. Lungs/Pleura: Stable 5 mm left lower lobe small peripheral pulmonary nodule compatible benign etiology (series 6, image 68). Otherwise lungs are clear. No pleural effusion or pneumothorax. Upper Abdomen: No acute abnormality. Musculoskeletal: No chest wall abnormality. No acute or significant osseous findings. Review of the MIP images confirms the above findings. IMPRESSION: 1. No pulmonary embolus identified. 2. Mild aortic calcific atherosclerosis. 3. Otherwise unremarkable CTA of the chest. Electronically Signed   By: Kristine Garbe M.D.   On: 03/18/2019 00:38   Dg Chest Port 1 View  Result Date: 03/17/2019 CLINICAL DATA:  Sudden onset of chest pain. EXAM: PORTABLE CHEST 1 VIEW COMPARISON:  Radiograph and CT 12/22/2018 FINDINGS: Heart is normal  in size. Normal mediastinal contours. Development of peribronchial and interstitial thickening. No focal airspace disease. No pleural effusion or pneumothorax. Degenerative change of both acromioclavicular joints. IMPRESSION: Development of peribronchial and interstitial thickening, may be bronchitis, pulmonary edema felt less likely given normal heart size. Electronically Signed   By: Keith Rake M.D.   On: 03/17/2019 23:31    EKG:   Orders placed or performed during the hospital encounter of 03/17/19  . ED EKG  . ED EKG      Management plans discussed with the patient, family and they are in agreement.  CODE STATUS:     Code Status Orders  (From admission, onward)         Start     Ordered   03/18/19 0301  Full code  Continuous     03/18/19 0300        Code Status History    This patient has a current code status but no historical code status.   Advance Care Planning Activity      TOTAL TIME TAKING CARE OF THIS PATIENT: 43 minutes.   Note: This dictation was prepared with Dragon dictation along with  smaller phrase technology. Any transcriptional errors that result from this process are unintentional.   @[facility] @  on 03/19/2019 at 1:03 PM  Between 7am to 6pm - Pager - [phone]  After 6pm go to [email or web address] - password [email or web address] [place] [facility]  Office  [phone]  CC: Primary care physician; Audie Pinto, PA-C

## 2019-03-19 NOTE — Discharge Instructions (Signed)
Follow-up with primary care physician in 2 to 3 days.  Repeat PT/INR in 2 days for further management of Coumadin by primary care physician Follow-up with cardiology Dr. Nehemiah Massed in a week

## 2019-03-19 NOTE — Progress Notes (Signed)
ANTICOAGULATION CONSULT NOTE - Initial Consult  Pharmacy Consult for Warfarin Dosing Indication: h/o PE  Allergies  Allergen Reactions  . Influenza Vaccines Swelling  . Morphine And Related Shortness Of Breath  . Pneumococcal Polysaccharide Vaccine Swelling  . Toradol [Ketorolac Tromethamine] Other (See Comments)    Hallucinations     Patient Measurements: Height: 5\' 7"  (170.2 cm) Weight: (!) 335 lb 12.2 oz (152.3 kg) IBW/kg (Calculated) : 61.6 Heparin Dosing Weight:    Vital Signs: Temp: 98.4 F (36.9 C) (06/18 0732) Temp Source: Oral (06/18 0732) BP: 90/47 (06/18 0732) Pulse Rate: 76 (06/18 0732)  Labs: Recent Labs    03/17/19 2317 03/18/19 0540 03/18/19 0858 03/18/19 1446 03/19/19 0420  HGB 14.8  --   --   --   --   HCT 44.2  --   --   --   --   PLT 365  --   --   --   --   LABPROT 22.8*  --   --   --  21.8*  INR 2.0*  --   --   --  1.9*  CREATININE 0.62  --   --   --   --   TROPONINI <0.03 <0.03 <0.03 <0.03  --     Estimated Creatinine Clearance: 128.6 mL/min (by C-G formula based on SCr of 0.62 mg/dL).   Medical History: Past Medical History:  Diagnosis Date  . CHF (congestive heart failure) ([place])   . COPD (chronic obstructive pulmonary disease) ([place])   . DVT (deep venous thrombosis) ([place]) 2010  . Hypertension   . MI (myocardial infarction) ([place])    x3; admissions here and [facility]. Patient reports valvular heart disease; [facility] records reference CAD as well    Medications:  Home Warfarin Regimen: 5mg  daily except 7.5mg  on Friday  Assessment: Patient is a 53yo female admitted for chest pain. Pharmacy consulted to manage warfarin. INR on admission is 2.0.  Date INR Warfarin Dose  6/16 2 5  mg received on 6/17  6/18 1.9 5 mg (ordered)     Goal of Therapy:  INR 2-3 Monitor platelets by anticoagulation protocol: Yes   Plan:  Will continue with home dose of Warfarin 5mg  daily except for 7.5mg  on Friday. Will check daily INR as patient has been  started on Ceftriaxone. CBC stable - continue to monitor.   Eleonore Chiquito, PharmD, BCPS 03/19/2019 8:02 AM

## 2019-03-19 NOTE — Progress Notes (Signed)
Went over discharge instructions with medications and follow-up appointment with the patient. Discontinue peripheral IV and telemetry monitor. Awaiting for patient's ride.

## 2019-10-12 DIAGNOSIS — D3502 Benign neoplasm of left adrenal gland: Secondary | ICD-10-CM | POA: Insufficient documentation

## 2019-10-29 ENCOUNTER — Emergency Department
Admission: EM | Admit: 2019-10-29 | Discharge: 2019-10-30 | Disposition: A | Discharge status: Home / Self Care | Payer: Managed Care, Other (non HMO) | Attending: Emergency Medicine | Admitting: Emergency Medicine

## 2019-10-29 ENCOUNTER — Emergency Department: Payer: Managed Care, Other (non HMO)

## 2019-10-29 ENCOUNTER — Other Ambulatory Visit: Payer: Self-pay

## 2019-10-29 DIAGNOSIS — I509 Heart failure, unspecified: Secondary | ICD-10-CM | POA: Insufficient documentation

## 2019-10-29 DIAGNOSIS — F17200 Nicotine dependence, unspecified, uncomplicated: Secondary | ICD-10-CM | POA: Diagnosis not present

## 2019-10-29 DIAGNOSIS — M79605 Pain in left leg: Secondary | ICD-10-CM | POA: Diagnosis present

## 2019-10-29 DIAGNOSIS — M5442 Lumbago with sciatica, left side: Secondary | ICD-10-CM | POA: Insufficient documentation

## 2019-10-29 DIAGNOSIS — G8929 Other chronic pain: Secondary | ICD-10-CM | POA: Diagnosis not present

## 2019-10-29 DIAGNOSIS — F329 Major depressive disorder, single episode, unspecified: Secondary | ICD-10-CM | POA: Insufficient documentation

## 2019-10-29 DIAGNOSIS — J449 Chronic obstructive pulmonary disease, unspecified: Secondary | ICD-10-CM | POA: Insufficient documentation

## 2019-10-29 DIAGNOSIS — I252 Old myocardial infarction: Secondary | ICD-10-CM | POA: Insufficient documentation

## 2019-10-29 DIAGNOSIS — I2510 Atherosclerotic heart disease of native coronary artery without angina pectoris: Secondary | ICD-10-CM | POA: Diagnosis not present

## 2019-10-29 DIAGNOSIS — Z79899 Other long term (current) drug therapy: Secondary | ICD-10-CM | POA: Insufficient documentation

## 2019-10-29 DIAGNOSIS — Z7901 Long term (current) use of anticoagulants: Secondary | ICD-10-CM | POA: Diagnosis not present

## 2019-10-29 DIAGNOSIS — I110 Hypertensive heart disease with heart failure: Secondary | ICD-10-CM | POA: Insufficient documentation

## 2019-10-29 MED ORDER — PREDNISONE 10 MG (21) PO TBPK: ORAL_TABLET | ORAL | 0 refills | Status: DC

## 2019-10-29 MED ORDER — OXYCODONE-ACETAMINOPHEN 5-325 MG PO TABS
1.0000 | ORAL_TABLET | Freq: Once | ORAL | Status: AC
  Administered 2019-10-29: 1 via ORAL
  Filled 2019-10-29: qty 1

## 2019-10-29 MED ORDER — PREDNISONE 20 MG PO TABS
60.0000 mg | ORAL_TABLET | Freq: Once | ORAL | Status: AC
  Administered 2019-10-29: 20:00:00 60 mg via ORAL
  Filled 2019-10-29: qty 3

## 2019-10-29 MED ORDER — LIDOCAINE 5 % EX PTCH
1.0000 | MEDICATED_PATCH | CUTANEOUS | Status: DC
  Administered 2019-10-29: 1 via TRANSDERMAL
  Filled 2019-10-29: qty 1

## 2019-10-29 MED ORDER — LIDOCAINE 5 % EX PTCH: 1.0000 | MEDICATED_PATCH | Freq: Two times a day (BID) | CUTANEOUS | 0 refills | Status: AC

## 2019-10-29 MED ORDER — DICLOFENAC SODIUM 1 % EX GEL: 2.0000 g | Freq: Four times a day (QID) | CUTANEOUS | 0 refills | Status: AC

## 2019-10-29 NOTE — ED Notes (Signed)
Ultrasound tech at bedside

## 2019-10-29 NOTE — Discharge Instructions (Addendum)
Your leg ultrasounds do not show any blood clots in the legs.  Your swelling is likely due to gravity from being kept in a chair for long periods of time due to the chronic back pain.  We have prescribed some additional medications that may help with your symptoms, and you should continue following up closely with your primary care doctor and spine surgeon for ongoing management.

## 2019-10-29 NOTE — BH Assessment (Addendum)
Assessment Note  Mary Lutz is an 54 y.o. female. Who present to the ER with c/o of left side leg pain and bilateral leg swelling. This leg pain is chronic, onset X5wks ago. She shares that she has been dx with a slipped disc and that surgery is not an option due to her weight. She shares that she is in excruciating pain and that her PCP has been great at attempting to manage this pain, but it became intolerable today. She explains that she also has multiple social stressors. Pt is tearful as she explains that she has a special needs child at home as well as a child who has recently been admitted to an inpatient psychiatric facility. She voices that her lack of mobility limits her ability to care for herself and her family. No previous mental health issues reported. No current or previous outpatient provider reported. No previous inpatient hospitalizations reported, although she has in the past taken Zoloft to managed depressive sx (prescribed by PCP). Pt states that this has helped in the past although she was recently able to stop taking this after improvements in mood where noted. Pt. denies any active  suicidal ideation, plan or intent. She admits that she experiences suicidal thought secondary to her medical issues and pain. Pt states" If I have to live like this it is not the life that I want to live." Pt. denies the presence of any auditory or visual hallucinations at this time. Patient has contracted for safety and share several protective factors.Individualized protective factors include: patient has treatable psychiatric disorders and symptoms, strong responsibility for children, positive family connectedness, feels supported, no access to firearms.    Diagnosis: Major Depression   Past Medical History:  Past Medical History:  Diagnosis Date  . CHF (congestive heart failure) ([place])   . COPD (chronic obstructive pulmonary disease) ([place])   . DVT (deep venous thrombosis) ([place]) 2010  .  Hypertension   . MI (myocardial infarction) ([place])    x3; admissions here and [facility]. Patient reports valvular heart disease; [facility] records reference CAD as well    Past Surgical History:  Procedure Laterality Date  . CARDIAC CATHETERIZATION     no stents    Family History:  Family History  Problem Relation Age of Onset  . Diabetes Mellitus II Mother   . Diabetes Mellitus II Father   . Heart failure Father   . Valvular heart disease Father   . Hypertension Sister     Social History:  reports that she has been smoking. She has never used smokeless tobacco. She reports that she does not drink alcohol or use drugs.  Additional Social History:  Alcohol / Drug Use Pain Medications: SEE MAR Prescriptions: SEE MAR Over the Counter: SEE MAR History of alcohol / drug use?: No history of alcohol / drug abuse  CIWA: CIWA-Ar BP: (!) 151/86 Pulse Rate: 88 COWS:    Allergies:  Allergies  Allergen Reactions  . Influenza Vaccines Swelling  . Morphine And Related Shortness Of Breath  . Pneumococcal Polysaccharide Vaccine Swelling  . Toradol [Ketorolac Tromethamine] Other (See Comments)    Hallucinations     Home Medications: (Not in a hospital admission)   OB/GYN Status:  No LMP recorded. Patient is perimenopausal.  General Assessment Data Location of Assessment: [facility] ED TTS Assessment: In system Is this a Tele or Face-to-Face Assessment?: Tele Assessment Is this an Initial Assessment or a Re-assessment for this encounter?: Initial Assessment Patient Accompanied by:: N/A Language Other  than English: No Living Arrangements: Other (Comment) What gender do you identify as?: Female Marital status: Married Living Arrangements: Spouse/significant other Can pt return to current living arrangement?: Yes Admission Status: Voluntary Is patient capable of signing voluntary admission?: Yes Referral Source: Self/Family/Friend Insurance type: Ship broker Exam ([place]) Medical Exam completed: Yes  Crisis Care Plan Living Arrangements: Spouse/significant other Legal Guardian: Other:(none) Name of Psychiatrist: none Name of Therapist: none  Education Status Is patient currently in school?: No Is the patient employed, unemployed or receiving disability?: Unemployed  Risk to self with the past 6 months Suicidal Ideation: No Has patient been a risk to self within the past 6 months prior to admission? : Yes Suicidal Intent: No Has patient had any suicidal intent within the past 6 months prior to admission? : No Is patient at risk for suicide?: Yes Suicidal Plan?: No Has patient had any suicidal plan within the past 6 months prior to admission? : No What has been your use of drugs/alcohol within the last 12 months?: none Previous Attempts/Gestures: No How many times?: 0 Intentional Self Injurious Behavior: None Family Suicide History: Unknown Recent stressful life event(s): Recent negative physical changes, Loss (Comment) Persecutory voices/beliefs?: No Depression: Yes Depression Symptoms: Tearfulness, Fatigue, Guilt Substance abuse history and/or treatment for substance abuse?: No Suicide prevention information given to non-admitted patients: Yes  Risk to Others within the past 6 months Homicidal Ideation: No Does patient have any lifetime risk of violence toward others beyond the six months prior to admission? : No Thoughts of Harm to Others: No Current Homicidal Intent: No Current Homicidal Plan: No Identified Victim: none History of harm to others?: No Assessment of Violence: None Noted Does patient have access to weapons?: No Criminal Charges Pending?: No Does patient have a court date: No Is patient on probation?: No  Psychosis Hallucinations: None noted Delusions: None noted  Mental Status Report Appearance/Hygiene: In scrubs Eye Contact: Fair Motor Activity: Other (Comment)(in bed at the time of assessment  ) Speech: Logical/coherent Level of Consciousness: Alert Mood: Depressed, Anxious Affect: Anxious, Depressed Anxiety Level: Moderate Thought Processes: Relevant, Coherent Judgement: Unimpaired Orientation: Time, Place, Person, Situation Obsessive Compulsive Thoughts/Behaviors: None  Cognitive Functioning Concentration: Fair Memory: Remote Intact, Recent Intact Insight: Fair Impulse Control: Fair Appetite: Fair Have you had any weight changes? : No Change Sleep: Increased Total Hours of Sleep: (unknown ) Vegetative Symptoms: None  ADLScreening [facility] Assessment Services) Patient's cognitive ability adequate to safely complete daily activities?: Yes Patient able to express need for assistance with ADLs?: Yes Independently performs ADLs?: Yes (appropriate for developmental age)  Prior Inpatient Therapy Prior Inpatient Therapy: No  Prior Outpatient Therapy Prior Outpatient Therapy: No Does patient have an ACCT team?: No Does patient have Intensive In-House Services?  : No Does patient have [facility] services? : No Does patient have P4CC services?: No  ADL Screening (condition at time of admission) Patient's cognitive ability adequate to safely complete daily activities?: Yes Patient able to express need for assistance with ADLs?: Yes Independently performs ADLs?: Yes (appropriate for developmental age)       Abuse/Neglect Assessment (Assessment to be complete while patient is alone) Abuse/Neglect Assessment Can Be Completed: Yes Physical Abuse: Denies Verbal Abuse: Denies Sexual Abuse: Denies Exploitation of patient/patient's resources: Denies Self-Neglect: Denies Values / Beliefs Cultural Requests During Hospitalization: None Spiritual Requests During Hospitalization: None Consults Spiritual Care Consult Needed: No Transition of Care Team Consult Needed: No Advance Directives (For Healthcare) Does Patient Have a  Medical Advance Directive?: No Would patient like  information on creating a medical advance directive?: No - Patient declined          Disposition:Discharge recommended  Disposition Initial Assessment Completed for this Encounter: Yes Patient referred to: Other (Comment)(SOC Consult )  On Site Evaluation by:   Reviewed with Physician:    Laretta Alstrom 10/29/2019 11:24 PM

## 2019-10-29 NOTE — ED Triage Notes (Signed)
Pt arrives to ED from home via [facility] with c/c of left side leg pain and bilateral leg swelling. Leg pain is chronic but became intolerable today and swelling occurred this morning. EMS reports transport vitals of 166/82, p88, O2 sat 99% on room air. Upon arrival, pt A&Ox4, NAD, no respiratory Sx evident. Dr Joni Fears at bedside.

## 2019-10-29 NOTE — ED Provider Notes (Addendum)
[facility] Emergency Department Provider Note  ____________________________________________  Time seen: Approximately 11:49 PM  I have reviewed the triage vital signs and the nursing notes.   HISTORY  Chief Complaint Leg Pain (left side) and Leg Swelling (bilateral)    HPI Mary Lutz is a 54 y.o. female with a history of CHF COPD DVT hypertension MI and herniated lumbar disc causing sciatica who comes the ED complaining of left-sided leg pain and bilateral leg swelling.  She has chronic back pain and leg pain which are unchanged from baseline that she is being managed by primary care and spine surgery for.  Spine specialist recommended surgery but they are unable to do it currently because of the Covid pandemic.  She is compliant with her medications including warfarin.  She comes the ED today because of bilateral leg swelling that started this morning, gradual onset, persistent all day.  Denies chest pain or shortness of breath, no fevers or chills.  No weakness or paresthesia in the legs.  No bowel or bladder incontinence   Past Medical History:  Diagnosis Date  . CHF (congestive heart failure) ([place])   . COPD (chronic obstructive pulmonary disease) ([place])   . DVT (deep venous thrombosis) ([place]) 2010  . Hypertension   . MI (myocardial infarction) ([place])    x3; admissions here and [facility]. Patient reports valvular heart disease; [facility] records reference CAD as well     Patient Active Problem List   Diagnosis Date Noted  . Chest pain 03/18/2019     Past Surgical History:  Procedure Laterality Date  . CARDIAC CATHETERIZATION     no stents     Prior to Admission medications   Medication Sig Start Date End Date Taking? Authorizing Provider  acetaminophen (TYLENOL) 325 MG tablet Take 2 tablets (650 mg total) by mouth every 6 (six) hours as needed for mild pain (or Fever >/= 101). 03/19/19   Gouru, Illene Silver, MD  albuterol (VENTOLIN HFA) 108 (90 Base)  MCG/ACT inhaler Inhale 2 puffs into the lungs every 4 (four) hours as needed for wheezing or shortness of breath. 12/18/18   [provider]  ANORO ELLIPTA 62.5-25 MCG/INH AEPB Inhale 1 puff into the lungs daily. 12/22/18   [provider]  diclofenac Sodium (VOLTAREN) 1 % GEL Apply 2 g topically 4 (four) times daily. 10/29/19   Carrie Mew, MD  ipratropium-albuterol (DUONEB) 0.5-2.5 (3) MG/3ML SOLN Inhale 3 mLs into the lungs 4 (four) times daily as needed for wheezing or cough. 10/08/18   [provider]  lidocaine (LIDODERM) 5 % Place 1 patch onto the skin every 12 (twelve) hours. Remove & Discard patch within 12 hours or as directed by MD 10/29/19   Carrie Mew, MD  lisinopril (ZESTRIL) 10 MG tablet Take 10 mg by mouth at bedtime.  12/22/18   [provider]  medroxyPROGESTERone (PROVERA) 10 MG tablet Take 10 mg by mouth at bedtime.  02/05/18   [provider]  nicotine (NICODERM CQ - DOSED IN MG/24 HOURS) 14 mg/24hr patch Place 1 patch (14 mg total) onto the skin daily. 03/20/19   Gouru, Illene Silver, MD  predniSONE (STERAPRED UNI-PAK 21 TAB) 10 MG (21) TBPK tablet 6 tablets on day 1, then 5 tablets on day 2, then 4 tablets on day 3, then 3 tablets on day 4, then 2 tablets on day 5, then 1 tablet on day 6. 10/29/19   Carrie Mew, MD  sertraline (ZOLOFT) 100 MG tablet Take 100 mg by  mouth daily. 12/04/18   [provider]  warfarin (COUMADIN) 5 MG tablet Take 5-7.5 mg by mouth at bedtime. Take 7.5mg  on Friday. Take 5mg  on all other days.    [provider]     Allergies Influenza vaccines, Morphine and related, Pneumococcal polysaccharide vaccine, and Toradol [ketorolac tromethamine]   Family History  Problem Relation Age of Onset  . Diabetes Mellitus II Mother   . Diabetes Mellitus II Father   . Heart failure Father   . Valvular heart disease Father   . Hypertension Sister     Social History Social History   Tobacco  Use  . Smoking status: Current Some Day Smoker  . Smokeless tobacco: Never Used  Substance Use Topics  . Alcohol use: No  . Drug use: No    Review of Systems  Constitutional:   No fever or chills.  ENT:   No sore throat. No rhinorrhea. Cardiovascular:   No chest pain or syncope. Respiratory:   No dyspnea or cough. Gastrointestinal:   Negative for abdominal pain, vomiting and diarrhea.  Musculoskeletal:   Chronic back pain and leg pain.  Bilateral lower extremity swelling. All other systems reviewed and are negative except as documented above in ROS and HPI.  ____________________________________________   PHYSICAL EXAM:  VITAL SIGNS: ED Triage Vitals  Enc Vitals Group     BP 10/29/19 1929 (!) 175/101     Pulse Rate 10/29/19 1929 (!) 101     Resp 10/29/19 1929 19     Temp --      Temp src --      SpO2 10/29/19 1929 99 %     Weight 10/29/19 1938 (!) 313 lb (142 kg)     Height 10/29/19 1938 5\' 7"  (1.702 m)     Head Circumference --      Peak Flow --      Pain Score 10/29/19 1934 8     Pain Loc --      Pain Edu? --      Excl. in [place]? --     Vital signs reviewed, nursing assessments reviewed.   Constitutional:   Alert and oriented. Non-toxic appearance. Eyes:   Conjunctivae are normal. EOMI. PERRL. ENT      Head:   Normocephalic and atraumatic.      Nose:   Wearing a mask.      Mouth/Throat:   Wearing a mask.      Neck:   No meningismus. Full ROM. Hematological/Lymphatic/Immunilogical:   No cervical lymphadenopathy. Cardiovascular:   RRR. Symmetric bilateral radial and DP pulses.  No murmurs. Cap refill less than 2 seconds. Respiratory:   Normal respiratory effort without tachypnea/retractions. Breath sounds are clear and equal bilaterally. No wheezes/rales/rhonchi. Gastrointestinal:   Soft and nontender. Non distended. There is no CVA tenderness.  No rebound, rigidity, or guarding.  Musculoskeletal:   Normal range of motion in all extremities. No joint effusions.   Diffuse tenderness about lumbar spine and paraspinous musculature.  Symmetric calf circumference, 1+ pitting edema bilateral lower extremities, slightly worse on the left compared to right.  Negative Homans' sign.  EHL strength intact.  No palpable cords.  No inflammatory changes. Neurologic:   Normal speech and language.  Motor grossly intact. No acute focal neurologic deficits are appreciated.  Skin:    Skin is warm, dry and intact. No rash noted.  No petechiae, purpura, or bullae.  ____________________________________________    LABS (pertinent positives/negatives) (all labs ordered are listed, but only abnormal  results are displayed) Labs Reviewed - No data to display ____________________________________________   EKG    ____________________________________________    RADIOLOGY  US Venous Img Lower Bilateral  Result Date: 10/29/2019 CLINICAL DATA:  Initial evaluation for bilateral lower extremity swelling, left greater than right, with left lower extremity pain. EXAM: BILATERAL LOWER EXTREMITY VENOUS DOPPLER ULTRASOUND TECHNIQUE: Gray-scale sonography with graded compression, as well as color Doppler and duplex ultrasound were performed to evaluate the lower extremity deep venous systems from the level of the common femoral vein and including the common femoral, femoral, profunda femoral, popliteal and calf veins including the posterior tibial, peroneal and gastrocnemius veins when visible. The superficial great saphenous vein was also interrogated. Spectral Doppler was utilized to evaluate flow at rest and with distal augmentation maneuvers in the common femoral, femoral and popliteal veins. COMPARISON:  None. FINDINGS: RIGHT LOWER EXTREMITY Common Femoral Vein: No evidence of thrombus. Normal compressibility, respiratory phasicity and response to augmentation. Saphenofemoral Junction: No evidence of thrombus. Normal compressibility and flow on color Doppler imaging. Profunda Femoral  Vein: No evidence of thrombus. Normal compressibility and flow on color Doppler imaging. Femoral Vein: No evidence of thrombus. Normal compressibility, respiratory phasicity and response to augmentation. Popliteal Vein: No evidence of thrombus. Normal compressibility, respiratory phasicity and response to augmentation. Calf Veins: No evidence of thrombus. Normal compressibility and flow on color Doppler imaging. Superficial Great Saphenous Vein: No evidence of thrombus. Normal compressibility. Venous Reflux:  None. Other Findings:  None. LEFT LOWER EXTREMITY Common Femoral Vein: No evidence of thrombus. Normal compressibility, respiratory phasicity and response to augmentation. Saphenofemoral Junction: No evidence of thrombus. Normal compressibility and flow on color Doppler imaging. Profunda Femoral Vein: No evidence of thrombus. Normal compressibility and flow on color Doppler imaging. Femoral Vein: No evidence of thrombus. Normal compressibility, respiratory phasicity and response to augmentation. Popliteal Vein: No evidence of thrombus. Normal compressibility, respiratory phasicity and response to augmentation. Calf Veins: No evidence of thrombus. Normal compressibility and flow on color Doppler imaging. Superficial Great Saphenous Vein: No evidence of thrombus. Normal compressibility. Venous Reflux:  None. Other Findings:  None. IMPRESSION: No evidence of deep venous thrombosis in either lower extremity. Electronically Signed   By: Jeannine Boga M.D.   On: 10/29/2019 20:39    ____________________________________________   PROCEDURES Procedures  ____________________________________________  DIFFERENTIAL DIAGNOSIS   Chronic pain, dependent edema, DVT  CLINICAL IMPRESSION / ASSESSMENT AND PLAN / ED COURSE  Medications ordered in the ED: Medications  lidocaine (LIDODERM) 5 % 1 patch (1 patch Transdermal Patch Applied 10/29/19 2000)  oxyCODONE-acetaminophen (PERCOCET/ROXICET) 5-325 MG per  tablet 1 tablet (1 tablet Oral Given 10/29/19 1958)  predniSONE (DELTASONE) tablet 60 mg (60 mg Oral Given 10/29/19 1958)    Pertinent labs & imaging results that were available during my care of the patient were reviewed by me and considered in my medical decision making (see chart for details).  Mary Lutz was evaluated in Emergency Department on 10/29/2019 for the symptoms described in the history of present illness. She was evaluated in the context of the global COVID-19 pandemic, which necessitated consideration that the patient might be at risk for infection with the SARS-CoV-2 virus that causes COVID-19. Institutional protocols and algorithms that pertain to the evaluation of patients at risk for COVID-19 are in a state of rapid change based on information released by regulatory bodies including the CDC and federal and state organizations. These policies and algorithms were followed during the patient's care in the ED.  Patient presents with new onset of bilateral leg swelling likely gravity dependent due to being stuck in recliner chair for long periods of time because of her chronic pain which is worse than usual due to not taking the Lyrica because of its side effects interfering with her ability to care for her child.  Ultrasound of bilateral lower extremities obtained which is negative for DVT.  Patient given prednisone and Lidoderm patch to help with her pain as well as an oxycodone tablet.  While in the ED, she reported some passive suicidal ideation to the nurse, so obtain a psychiatry consult for further screening prior to discharge home to continue her outpatient chronic pain management.  Prescriptions for Voltaren gel, Lidoderm patch, prednisone taper.   ----------------------------------------- 12:10 AM on 10/30/2019 -----------------------------------------  Had further conversation with the patient about her mental health considerations, she denies any SI or HI at this time,  no hallucinations.  She is confident that she is not to do anything to harm herself, but was just speaking out of frustration due to her pain.  She is future oriented, feels motivated to keep yourself healthy so that she can take care of her child who has autism spectrum disorder and requires tube feedings.     ____________________________________________   FINAL CLINICAL IMPRESSION(S) / ED DIAGNOSES    Final diagnoses:  Chronic left-sided low back pain with left-sided sciatica     ED Discharge Orders         Ordered    predniSONE (STERAPRED UNI-PAK 21 TAB) 10 MG (21) TBPK tablet     10/29/19 2348    lidocaine (LIDODERM) 5 %  Every 12 hours     10/29/19 2348    diclofenac Sodium (VOLTAREN) 1 % GEL  4 times daily     10/29/19 2348          Portions of this note were generated with dragon dictation software. Dictation errors may occur despite best attempts at proofreading.   Carrie Mew, MD 10/29/19 Kennedy Bucker    Carrie Mew, MD 10/30/19 [id number]

## 2020-02-16 IMAGING — CR DG PELVIS 1-2V
1 series · 1 of 1 positions shown · non-contrast
Comparison: 02/18/2018 CT abdomen and pelvis

CLINICAL DATA: Right knee and hip pain after trip and fall.

EXAM:
PELVIS - 1-2 VIEW

[dg pelvis 1-2 views]
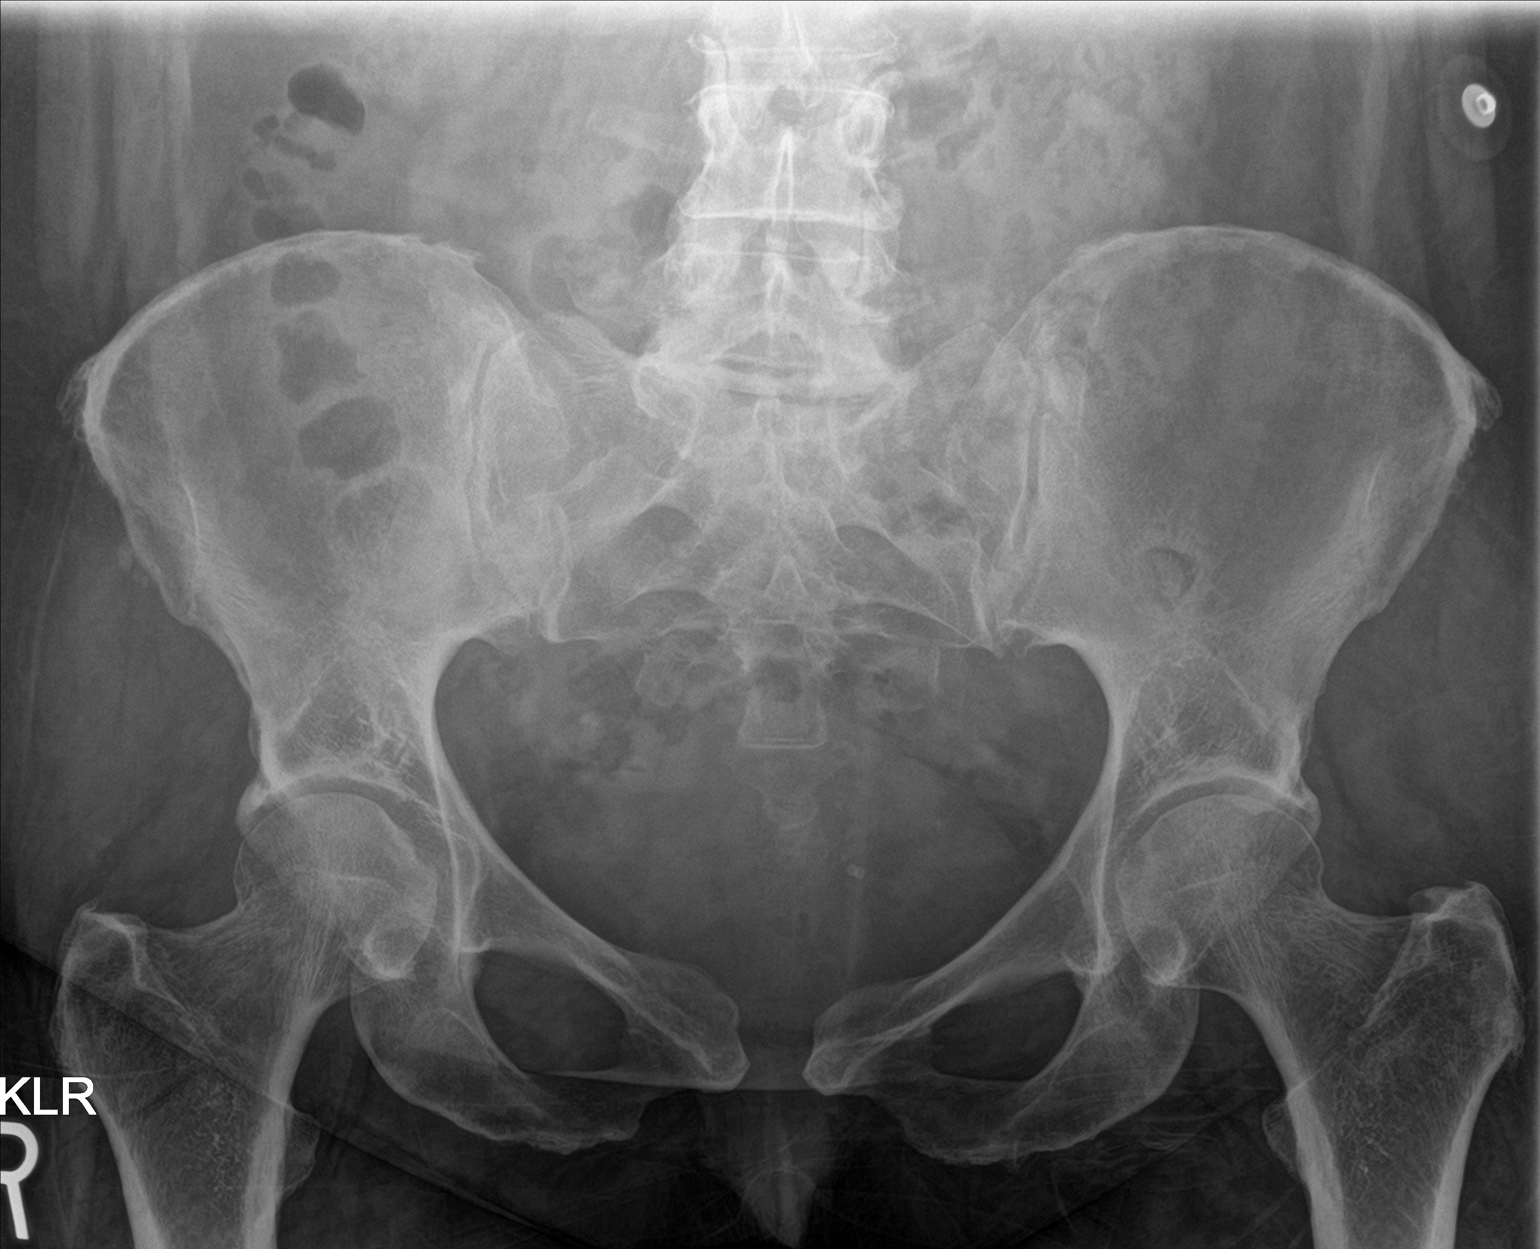

[1 of 1 positions shown; findings below may reference images not displayed]

FINDINGS: Slight disc space narrowing of the included lumbar spine from L3
through S1 with facet sclerosis at L4-5 and L5-S1. No acute pelvic
fracture. Joint space narrowing is identified about both hips. No
joint dislocation or fracture. Chronic diastasis of the pubic
symphysis is identified likely reflective of old remote trauma. This
measures up to 2 cm transverse, previously 1.7 cm on CT. This could
be due to differences in technique.
IMPRESSION: 1. No acute fracture.
2. Chronic diastatic appearance of the pubic symphysis though
slightly wider in transverse dimension and 2 cm versus 1.7 cm on
prior CT. This may simply be due to differences in technique and
projection. If the patient does have pain region of the pubic
symphysis, consider acute on chronic change.

## 2020-02-16 IMAGING — CT CT HEAD W/O CM
3 of 4 series · 16 of 47 positions shown, 19 images · non-contrast
Comparison: 02/18/2018 head CT.

CLINICAL DATA: 52-year-old female post fall. On blood thinner.
Initial encounter.

EXAM:
CT HEAD WITHOUT CONTRAST
TECHNIQUE: Contiguous axial images were obtained from the base of the skull
through the vertex without intravenous contrast.

[Series 3: head wo · axial · 0.41mm/px · z∈[+343,+463]mm · 10 of 30 slices shown, 13 images]
[im 3/30  brain]
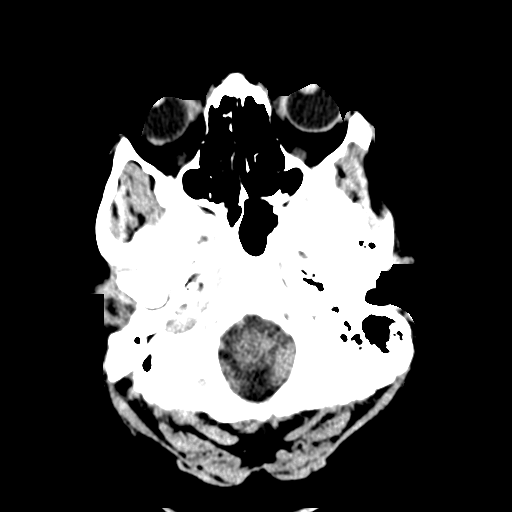
[im 3/30  bone]
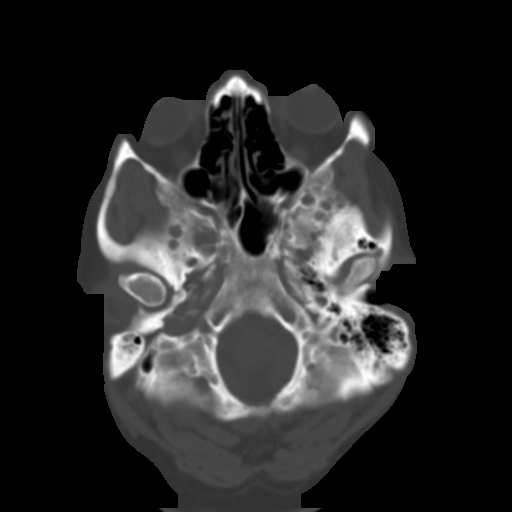
[im 5/30  brain]
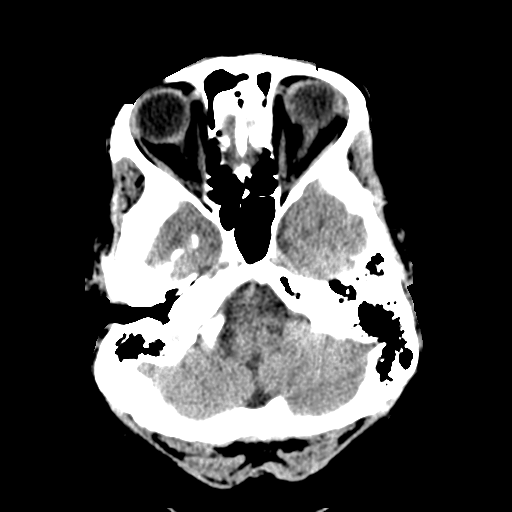
[im 9/30  brain]
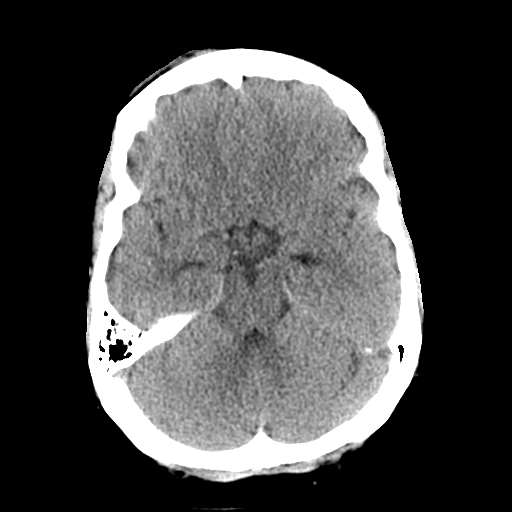
[im 11/30  brain]
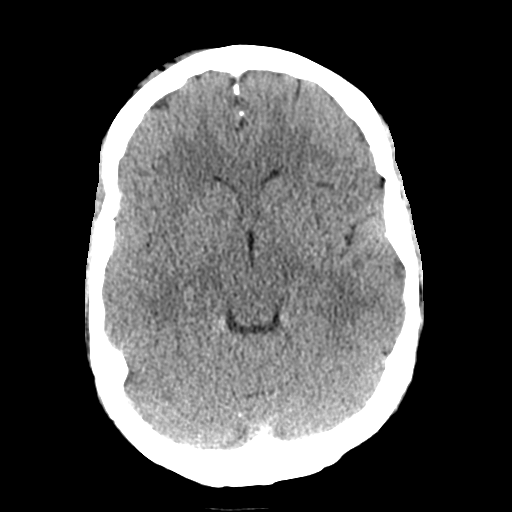
[im 13/30  brain]
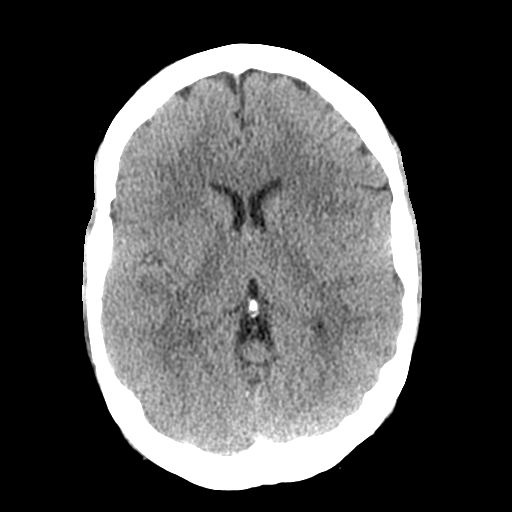
[im 13/30  bone]
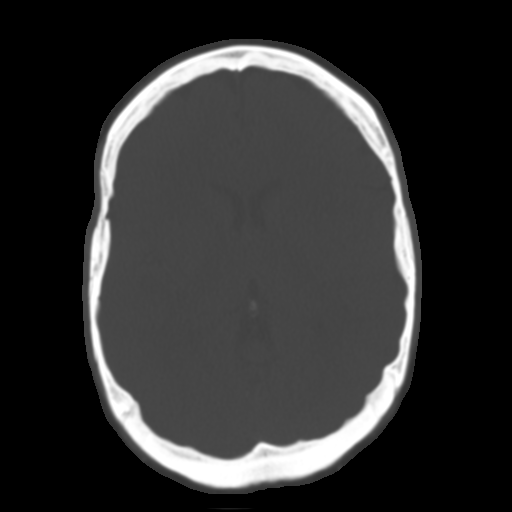
[im 17/30  brain]
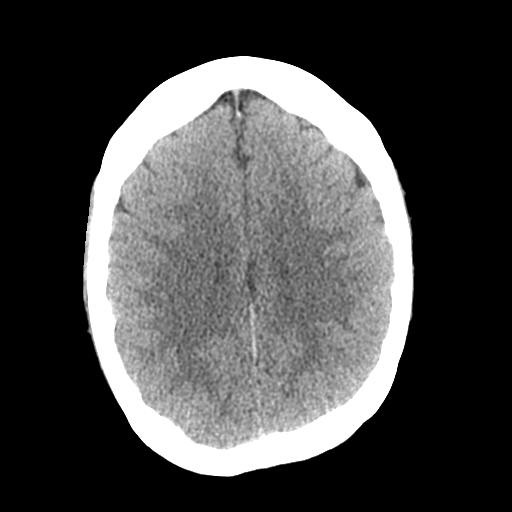
[im 19/30  brain]
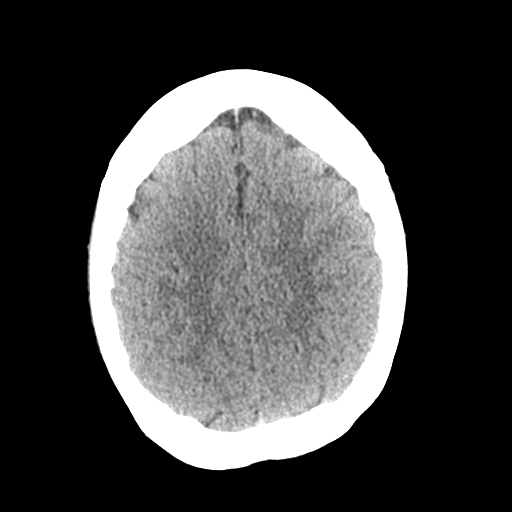
[im 21/30  brain]
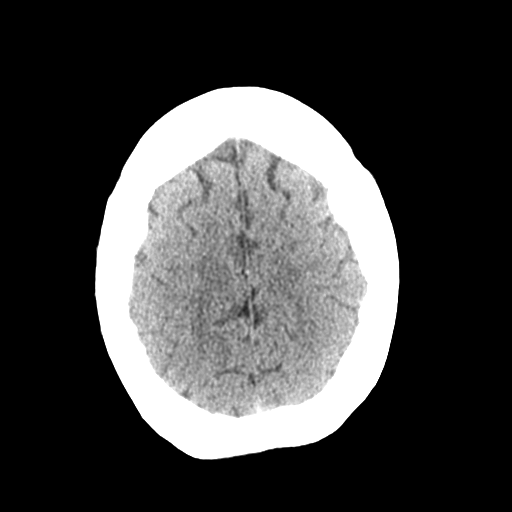
[im 25/30  brain]
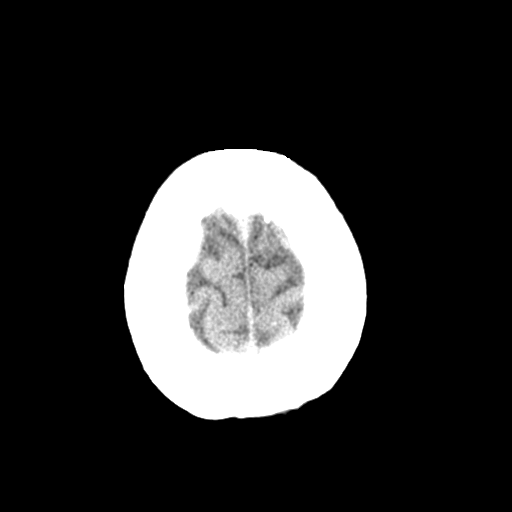
[im 25/30  bone]
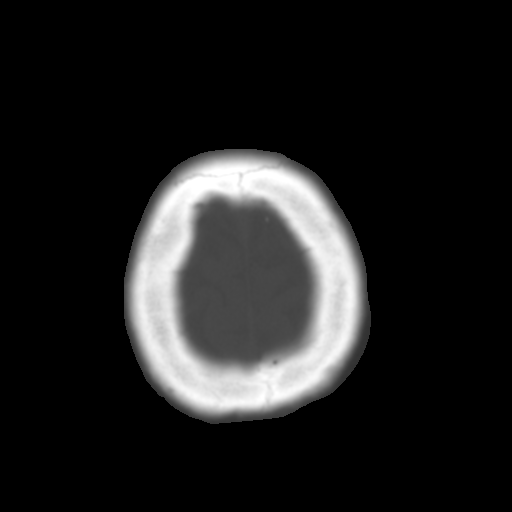
[im 27/30  brain]
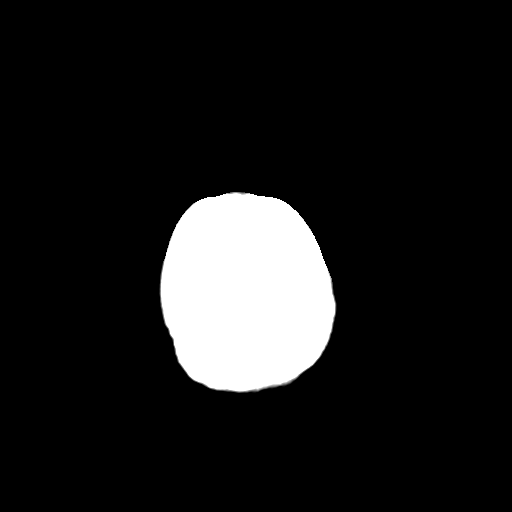

[Series 4: coronal soft tissue · coronal · 0.32mm/px · 3 of 58 slices shown]
[im 20/58  brain]
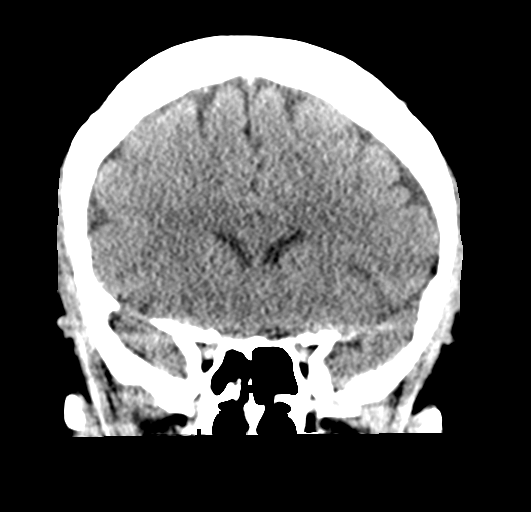
[im 26/58  brain]
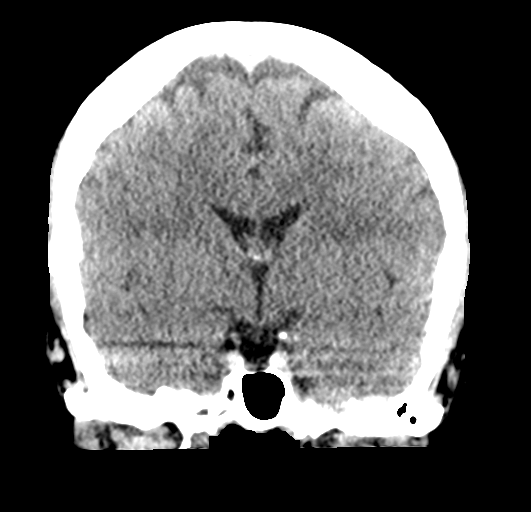
[im 32/58  brain]
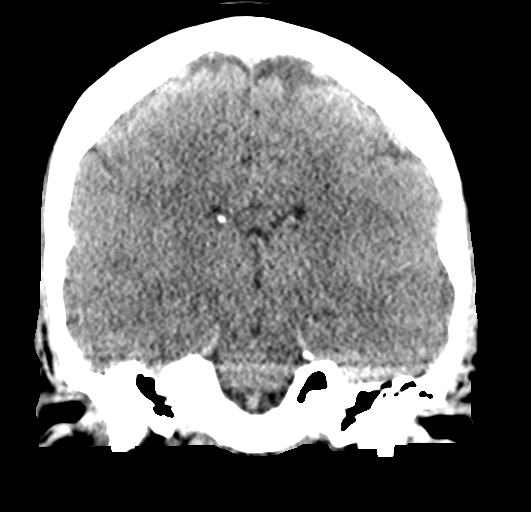

[Series 5: sagittal soft tissue · sagittal · 0.32mm/px · 3 of 47 slices shown]
[im 16/47  brain]
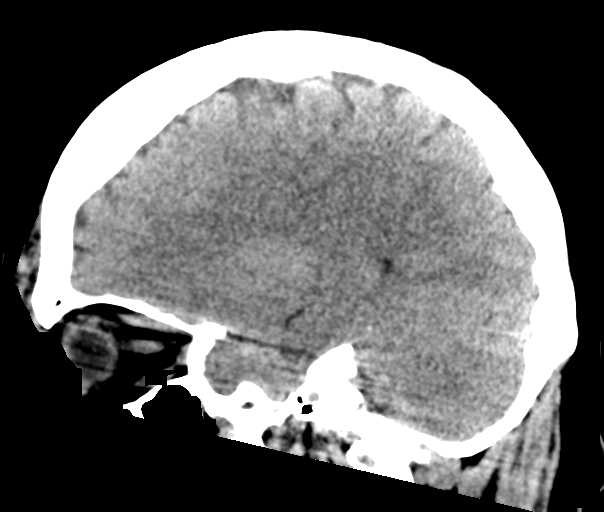
[im 24/47  brain]
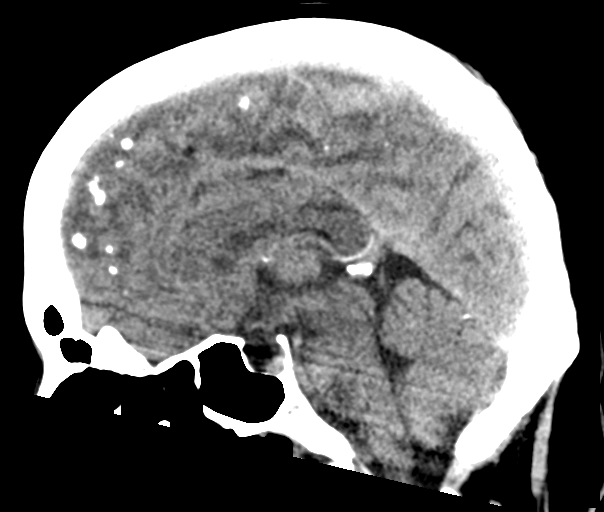
[im 31/47  brain]
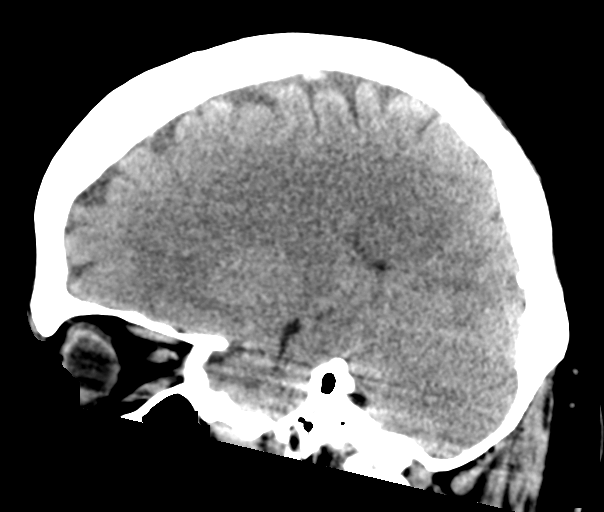

[16 of 47 positions shown; findings below may reference images not displayed]

FINDINGS: Brain: No intracranial hemorrhage or CT evidence of large acute
infarct. No intracranial mass lesion noted on this unenhanced exam.
Expanded partially empty sella unchanged.

Vascular: No hyperdense vessel

Skull: No skull fracture

Sinuses/Orbits: Visualized orbital structures unremarkable.
Visualized paranasal sinuses are clear. Mastoid air cells and middle
ear cavities are clear.

Other: Right frontal/supraorbital region scalp hematoma.
IMPRESSION: Right supraorbital/frontal region scalp hematoma. No underlying
fracture or intracranial hemorrhage.

## 2020-10-15 ENCOUNTER — Emergency Department
Admission: EM | Admit: 2020-10-15 | Discharge: 2020-10-15 | Disposition: A | Discharge status: Home / Self Care | Payer: Managed Care, Other (non HMO) | Attending: Emergency Medicine | Admitting: Emergency Medicine

## 2020-10-15 ENCOUNTER — Other Ambulatory Visit: Payer: Self-pay

## 2020-10-15 ENCOUNTER — Encounter: Payer: Self-pay | Admitting: Intensive Care

## 2020-10-15 DIAGNOSIS — Z5321 Procedure and treatment not carried out due to patient leaving prior to being seen by health care provider: Secondary | ICD-10-CM | POA: Insufficient documentation

## 2020-10-15 DIAGNOSIS — R079 Chest pain, unspecified: Secondary | ICD-10-CM | POA: Diagnosis present

## 2020-10-15 DIAGNOSIS — I252 Old myocardial infarction: Secondary | ICD-10-CM | POA: Insufficient documentation

## 2020-10-15 DIAGNOSIS — J449 Chronic obstructive pulmonary disease, unspecified: Secondary | ICD-10-CM | POA: Diagnosis not present

## 2020-10-15 LAB — BASIC METABOLIC PANEL
Anion gap: 13 (ref 5–15)
BUN: 12 mg/dL (ref 6–20)
CO2: 22 mmol/L (ref 22–32)
Calcium: 9.5 mg/dL (ref 8.9–10.3)
Chloride: 103 mmol/L (ref 98–111)
Creatinine, Ser: 0.76 mg/dL (ref 0.44–1.00)
GFR, Estimated: >60 mL/min (ref >60)
Glucose, Bld: 122 mg/dL — ABNORMAL HIGH (ref 70–99)
Potassium: 4.2 mmol/L (ref 3.5–5.1)
Sodium: 138 mmol/L (ref 135–145)

## 2020-10-15 LAB — APTT: aPTT: 52 seconds — ABNORMAL HIGH (ref 24–36)

## 2020-10-15 LAB — CBC
HCT: 48.7 % — ABNORMAL HIGH (ref 36.0–46.0)
Hemoglobin: 15.9 g/dL — ABNORMAL HIGH (ref 12.0–15.0)
MCH: 30.5 pg (ref 26.0–34.0)
MCHC: 32.6 g/dL (ref 30.0–36.0)
MCV: 93.3 fL (ref 80.0–100.0)
Platelets: 360 10*3/uL (ref 150–400)
RBC: 5.22 MIL/uL — ABNORMAL HIGH (ref 3.87–5.11)
RDW: 14.1 % (ref 11.5–15.5)
WBC: 11 10*3/uL — ABNORMAL HIGH (ref 4.0–10.5)
nRBC: 0 % (ref 0.0–0.2)

## 2020-10-15 LAB — TROPONIN I (HIGH SENSITIVITY)
Troponin I (High Sensitivity): 5 ng/L (ref <18)
Troponin I (High Sensitivity): 6 ng/L (ref <18)

## 2020-10-15 LAB — PROTIME-INR
INR: 3.4 — ABNORMAL HIGH (ref 0.8–1.2)
Prothrombin Time: 33.1 seconds — ABNORMAL HIGH (ref 11.4–15.2)

## 2020-10-15 NOTE — ED Notes (Signed)
Pt agreeable to f/u with MD

## 2020-10-15 NOTE — ED Triage Notes (Signed)
Pt in via EMS from home with c/o CP. EMS reports started 35 minutes PTA, sudden onset, sharp in nature and non-radiating. Pt with hx of MI's one at 44 and one at 77. Hx of COPD. Pt was given 324mg  of asa, 1 451mcg nitro spray at 12:36 and a second spray at 12:41. 154/92, HR 103. Allergic to morphine, 96% RA, FSBS 119

## 2020-10-15 NOTE — ED Notes (Signed)
Pt reported to RN that she was leaving. RN encouraged pt to stay but pt states she has already called ride and they are on the way. RN and pt discussed pt following up with her MD and returning for worsening sx's. Pt also agreeable to have her second troponin drawn before she left. Labs drawn, IV removed and pt waiting on ride

## 2021-05-05 ENCOUNTER — Other Ambulatory Visit: Payer: Self-pay

## 2021-05-05 ENCOUNTER — Encounter: Payer: Self-pay | Admitting: Emergency Medicine

## 2021-05-05 ENCOUNTER — Ambulatory Visit
Admission: EM | Admit: 2021-05-05 | Discharge: 2021-05-05 | Disposition: A | Discharge status: Home / Self Care | Attending: Sports Medicine | Admitting: Sports Medicine

## 2021-05-05 DIAGNOSIS — M79675 Pain in left toe(s): Secondary | ICD-10-CM | POA: Diagnosis not present

## 2021-05-05 DIAGNOSIS — I891 Lymphangitis: Secondary | ICD-10-CM | POA: Diagnosis not present

## 2021-05-05 DIAGNOSIS — L03032 Cellulitis of left toe: Secondary | ICD-10-CM | POA: Diagnosis not present

## 2021-05-05 MED ORDER — DOXYCYCLINE HYCLATE 100 MG PO CAPS: 100.0000 mg | ORAL_CAPSULE | Freq: Two times a day (BID) | ORAL | 0 refills | Status: DC

## 2021-05-05 NOTE — Discharge Instructions (Addendum)
As we discussed, you have an infection in your left third toe.  No streaking as your body's lymph system trying to get rid of the bacteria. I did send in an antibiotic to your pharmacy. Please reach out to the physician that is prescribing your anticoagulation medication in case you need your dose adjusted while you are on the antibiotic.  You may also need your blood levels monitored more frequently for the next little while. Please see educational handouts. Please follow-up with your primary care provider if symptoms persist. If they worsen then please go to the emergency room as you may need IV antibiotics. Over-the-counter such as Tylenol or ibuprofen for any discomfort.

## 2021-05-05 NOTE — ED Triage Notes (Signed)
Patient c/o redness, swelling and pain in her left 3rd toe that started 2 days ago.

## 2021-05-05 NOTE — ED Provider Notes (Signed)
[facility]    CSN: [id number] Arrival date & time: 05/05/21  1457      History   Chief Complaint Chief Complaint  Patient presents with   Toe Pain    Left 3rd toe    HPI Mary Lutz is a 55 y.o. female.   55 year old female who presents for evaluation of pain and redness at her third toe.  Symptoms began 2 days ago but progressed today.  She noticed a little streaking up her left foot and there was concern from her husband who brought her to the urgent care.  No accidents trauma or falls.  No calf pain or swelling.  She is on disability due to multiple medical issues including knee osteoarthritis, morbid obesity that requires her to use a walker.  Normally sees [facility] in [place] but they could not see her today.  She denies chest pain or shortness of breath.  No other issues or problems are offered.  Complete review of systems is noncontributory except for what is listed above in the HPI.   Past Medical History:  Diagnosis Date   CHF (congestive heart failure) (HCC)    COPD (chronic obstructive pulmonary disease) (HCC)    DVT (deep venous thrombosis) ([place]) 2010   Hypertension    MI (myocardial infarction) ([place])    x3; admissions here and [facility]. Patient reports valvular heart disease; [facility] records reference CAD as well    Patient Active Problem List   Diagnosis Date Noted   Chest pain 03/18/2019    Past Surgical History:  Procedure Laterality Date   CARDIAC CATHETERIZATION     no stents    OB History   No obstetric history on file.      Home Medications    Prior to Admission medications   Medication Sig Start Date End Date Taking? Authorizing Provider  albuterol (VENTOLIN HFA) 108 (90 Base) MCG/ACT inhaler Inhale 2 puffs into the lungs every 4 (four) hours as needed for wheezing or shortness of breath. 12/18/18  Yes [provider]  doxycycline (VIBRAMYCIN) 100 MG capsule Take 1 capsule (100 mg total) by mouth 2 (two) times  daily. 05/05/21  Yes Verda Cumins, MD  Fluticasone-Umeclidin-Vilant (TRELEGY ELLIPTA) 100-62.5-25 MCG/INH AEPB  06/30/20  Yes [provider]  lisinopril (ZESTRIL) 10 MG tablet Take 10 mg by mouth at bedtime.  12/22/18  Yes [provider]  medroxyPROGESTERone (PROVERA) 10 MG tablet Take 10 mg by mouth at bedtime.  02/05/18  Yes [provider]  methocarbamol (ROBAXIN) 750 MG tablet  01/27/21  Yes [provider]  traMADol (ULTRAM) 50 MG tablet Take 50 mg by mouth daily as needed. 01/27/21  Yes [provider]  warfarin (COUMADIN) 5 MG tablet Take 5-7.5 mg by mouth at bedtime. Take 7.'5mg'$  on Friday. Take '5mg'$  on all other days.   Yes [provider]  acetaminophen (TYLENOL) 325 MG tablet Take 2 tablets (650 mg total) by mouth every 6 (six) hours as needed for mild pain (or Fever >/= 101). 03/19/19   Gouru, Illene Silver, MD  ANORO ELLIPTA 62.5-25 MCG/INH AEPB Inhale 1 puff into the lungs daily. 12/22/18   [provider]  diclofenac Sodium (VOLTAREN) 1 % GEL Apply 2 g topically 4 (four) times daily. 10/29/19   Carrie Mew, MD  ipratropium-albuterol (DUONEB) 0.5-2.5 (3) MG/3ML SOLN Inhale 3 mLs into the lungs 4 (four) times daily as needed for wheezing or cough. 10/08/18   [provider]  lidocaine (LIDODERM) 5 %  Place 1 patch onto the skin every 12 (twelve) hours. Remove & Discard patch within 12 hours or as directed by MD 10/29/19   Carrie Mew, MD  nicotine (NICODERM CQ - DOSED IN MG/24 HOURS) 14 mg/24hr patch Place 1 patch (14 mg total) onto the skin daily. 03/20/19   Gouru, Illene Silver, MD  predniSONE (STERAPRED UNI-PAK 21 TAB) 10 MG (21) TBPK tablet 6 tablets on day 1, then 5 tablets on day 2, then 4 tablets on day 3, then 3 tablets on day 4, then 2 tablets on day 5, then 1 tablet on day 6. 10/29/19   Carrie Mew, MD  sertraline (ZOLOFT) 100 MG tablet Take 100 mg by mouth daily. 12/04/18   [provider]    Family  History Family History  Problem Relation Age of Onset   Diabetes Mellitus II Mother    Diabetes Mellitus II Father    Heart failure Father    Valvular heart disease Father    Hypertension Sister     Social History Social History   Tobacco Use   Smoking status: Some Days    Types: Cigarettes   Smokeless tobacco: Never  Vaping Use   Vaping Use: Never used  Substance Use Topics   Alcohol use: No   Drug use: No     Allergies   Influenza vaccines, Morphine and related, Pneumococcal polysaccharide vaccine, and Toradol [ketorolac tromethamine]   Review of Systems Review of Systems  Constitutional:  Positive for activity change. Negative for appetite change, chills, diaphoresis, fatigue and fever.  HENT:  Negative for congestion, ear pain, postnasal drip, rhinorrhea, sinus pressure, sinus pain, sneezing, sore throat and trouble swallowing.   Eyes:  Negative for pain.  Respiratory:  Negative for cough, chest tightness, shortness of breath and wheezing.   Cardiovascular:  Negative for chest pain, palpitations and leg swelling.  Gastrointestinal:  Negative for abdominal pain, diarrhea, nausea and vomiting.  Genitourinary:  Negative for dysuria.  Musculoskeletal:  Positive for arthralgias, gait problem and joint swelling. Negative for back pain, myalgias, neck pain and neck stiffness.  Skin:  Positive for color change. Negative for pallor, rash and wound.  Neurological:  Negative for dizziness, light-headedness and headaches.  All other systems reviewed and are negative.   Physical Exam Triage Vital Signs ED Triage Vitals  Enc Vitals Group     BP 05/05/21 1517 (!) 131/97     Pulse Rate 05/05/21 1517 100     Resp 05/05/21 1517 16     Temp 05/05/21 1517 97.6 F (36.4 C)     Temp Source 05/05/21 1517 Oral     SpO2 05/05/21 1517 96 %     Weight 05/05/21 1512 (!) 310 lb (140.6 kg)     Height 05/05/21 1512 '5\' 7"'$  (1.702 m)     Head Circumference --      Peak Flow --      Pain  Score 05/05/21 1512 9     Pain Loc --      Pain Edu? --      Excl. in [place]? --    No data found.  Updated Vital Signs BP (!) 131/97 (BP Location: Left Arm)   Pulse 100   Temp 97.6 F (36.4 C) (Oral)   Resp 16   Ht '5\' 7"'$  (1.702 m)   Wt (!) 140.6 kg   SpO2 96%   BMI 48.55 kg/m   Visual Acuity Right Eye Distance:   Left Eye Distance:   Bilateral Distance:  Right Eye Near:   Left Eye Near:    Bilateral Near:     Physical Exam Vitals and nursing note reviewed.  Constitutional:      General: She is not in acute distress.    Appearance: Normal appearance. She is obese. She is not ill-appearing, toxic-appearing or diaphoretic.     Comments: Has an antalgic gait pattern and uses a walker to assist with ambulation.  HENT:     Head: Normocephalic and atraumatic.     Nose: Nose normal.     Mouth/Throat:     Mouth: Mucous membranes are moist.  Eyes:     Conjunctiva/sclera: Conjunctivae normal.     Pupils: Pupils are equal, round, and reactive to light.  Cardiovascular:     Rate and Rhythm: Normal rate and regular rhythm.     Pulses: Normal pulses.     Heart sounds: Normal heart sounds. No murmur heard.   No friction rub. No gallop.  Pulmonary:     Effort: Pulmonary effort is normal.     Breath sounds: Normal breath sounds. No stridor. No wheezing, rhonchi or rales.  Musculoskeletal:        General: Swelling and tenderness present. No deformity or signs of injury.     Cervical back: Normal range of motion and neck supple.  Skin:    General: Skin is warm and dry.     Capillary Refill: Capillary refill takes less than 2 seconds.     Coloration: Skin is not jaundiced.     Findings: Erythema present. No abscess, bruising, signs of injury or rash.     Comments: Some redness around the nail without abscess.  There is tenderness to palpation and some mild swelling.  Exam is consistent with a paronychia, cellulitis, and a sending lymphangitis.  Neurological:     General: No  focal deficit present.     Mental Status: She is alert and oriented to person, place, and time.     UC Treatments / Results  Labs (all labs ordered are listed, but only abnormal results are displayed) Labs Reviewed - No data to display  EKG   Radiology No results found.  Procedures Procedures (including critical care time)  Medications Ordered in UC Medications - No data to display  Initial Impression / Assessment and Plan / UC Course  I have reviewed the triage vital signs and the nursing notes.  Pertinent labs & imaging results that were available during my care of the patient were reviewed by me and considered in my medical decision making (see chart for details).  Clinical impression: 1.  Paronychia to the left third toe 2.  Left toe pain 3.  Cellulitis to the left third toe 4.  Lymphangitis  Treatment plan: 1.  The findings and treatment plan were discussed in detail with the patient.  Patient was in agreement. 2.  We discussed that she does have in fact an infection.  The streaking is just secondary to the body lymphatic system trying to get rid of the bacteria. 3.  Went ahead and prescribed doxycycline for 10 days. 4.  I note that she is on chronic anticoagulation and I asked her to reach out to the physician who is prescribing that medication and see if she needs a dose adjustment.  She will probably need her INR checked more frequently.  I leave that to the discretion of the prescribing physician. 5.  Educational handouts provided. 6.  If symptoms persist I do want her to see  her primary care provider. 7.  If they worsen in any way she should go to the ER she may need IV antibiotics. 8.  Over-the-counter Tylenol or ibuprofen for any discomfort. 9.  She was discharged in stable condition will follow-up here as needed.    Final Clinical Impressions(s) / UC Diagnoses   Final diagnoses:  Paronychia of third toe, left  Toe pain, left  Cellulitis of middle toe,  left  Lymphangitis     Discharge Instructions      As we discussed, you have an infection in your left third toe.  No streaking as your body's lymph system trying to get rid of the bacteria. I did send in an antibiotic to your pharmacy. Please reach out to the physician that is prescribing your anticoagulation medication in case you need your dose adjusted while you are on the antibiotic.  You may also need your blood levels monitored more frequently for the next little while. Please see educational handouts. Please follow-up with your primary care provider if symptoms persist. If they worsen then please go to the emergency room as you may need IV antibiotics. Over-the-counter such as Tylenol or ibuprofen for any discomfort.     ED Prescriptions     Medication Sig Dispense Auth. Provider   doxycycline (VIBRAMYCIN) 100 MG capsule Take 1 capsule (100 mg total) by mouth 2 (two) times daily. 20 capsule Verda Cumins, MD      PDMP not reviewed this encounter.   Verda Cumins, MD 05/05/21 [phone]

## 2021-05-06 ENCOUNTER — Encounter: Payer: Self-pay | Admitting: Emergency Medicine

## 2021-05-06 ENCOUNTER — Other Ambulatory Visit: Payer: Self-pay

## 2021-05-06 ENCOUNTER — Emergency Department
Admission: EM | Admit: 2021-05-06 | Discharge: 2021-05-06 | Disposition: A | Discharge status: Home / Self Care | Attending: Emergency Medicine | Admitting: Emergency Medicine

## 2021-05-06 DIAGNOSIS — I509 Heart failure, unspecified: Secondary | ICD-10-CM | POA: Diagnosis not present

## 2021-05-06 DIAGNOSIS — I110 Hypertensive heart disease with heart failure: Secondary | ICD-10-CM | POA: Diagnosis not present

## 2021-05-06 DIAGNOSIS — F17210 Nicotine dependence, cigarettes, uncomplicated: Secondary | ICD-10-CM | POA: Diagnosis not present

## 2021-05-06 DIAGNOSIS — Z7951 Long term (current) use of inhaled steroids: Secondary | ICD-10-CM | POA: Insufficient documentation

## 2021-05-06 DIAGNOSIS — Z79899 Other long term (current) drug therapy: Secondary | ICD-10-CM | POA: Diagnosis not present

## 2021-05-06 DIAGNOSIS — J449 Chronic obstructive pulmonary disease, unspecified: Secondary | ICD-10-CM | POA: Insufficient documentation

## 2021-05-06 DIAGNOSIS — L03032 Cellulitis of left toe: Secondary | ICD-10-CM | POA: Diagnosis not present

## 2021-05-06 DIAGNOSIS — Z7901 Long term (current) use of anticoagulants: Secondary | ICD-10-CM | POA: Diagnosis not present

## 2021-05-06 DIAGNOSIS — M79672 Pain in left foot: Secondary | ICD-10-CM | POA: Diagnosis present

## 2021-05-06 LAB — CBC WITH DIFFERENTIAL/PLATELET
Abs Immature Granulocytes: 0.04 10*3/uL (ref 0.00–0.07)
Basophils Absolute: 0.1 10*3/uL (ref 0.0–0.1)
Basophils Relative: 1 %
Eosinophils Absolute: 0.2 10*3/uL (ref 0.0–0.5)
Eosinophils Relative: 2 %
HCT: 45.8 % (ref 36.0–46.0)
Hemoglobin: 15.1 g/dL — ABNORMAL HIGH (ref 12.0–15.0)
Immature Granulocytes: 0 %
Lymphocytes Relative: 24 %
Lymphs Abs: 3.1 10*3/uL (ref 0.7–4.0)
MCH: 31.5 pg (ref 26.0–34.0)
MCHC: 33 g/dL (ref 30.0–36.0)
MCV: 95.6 fL (ref 80.0–100.0)
Monocytes Absolute: 0.9 10*3/uL (ref 0.1–1.0)
Monocytes Relative: 7 %
Neutro Abs: 8.5 10*3/uL — ABNORMAL HIGH (ref 1.7–7.7)
Neutrophils Relative %: 66 %
Platelets: 407 10*3/uL — ABNORMAL HIGH (ref 150–400)
RBC: 4.79 MIL/uL (ref 3.87–5.11)
RDW: 13.3 % (ref 11.5–15.5)
WBC: 12.8 10*3/uL — ABNORMAL HIGH (ref 4.0–10.5)
nRBC: 0 % (ref 0.0–0.2)

## 2021-05-06 LAB — BASIC METABOLIC PANEL
Anion gap: 7 (ref 5–15)
BUN: 10 mg/dL (ref 6–20)
CO2: 25 mmol/L (ref 22–32)
Calcium: 8.7 mg/dL — ABNORMAL LOW (ref 8.9–10.3)
Chloride: 105 mmol/L (ref 98–111)
Creatinine, Ser: 0.62 mg/dL (ref 0.44–1.00)
GFR, Estimated: >60 mL/min (ref >60)
Glucose, Bld: 128 mg/dL — ABNORMAL HIGH (ref 70–99)
Potassium: 3.8 mmol/L (ref 3.5–5.1)
Sodium: 137 mmol/L (ref 135–145)

## 2021-05-06 MED ORDER — PENTAFLUOROPROP-TETRAFLUOROETH EX AERO
INHALATION_SPRAY | CUTANEOUS | Status: DC | PRN
  Filled 2021-05-06 (×2): qty 30

## 2021-05-06 MED ORDER — LIDOCAINE HCL (PF) 1 % IJ SOLN
5.0000 mL | Freq: Once | INTRAMUSCULAR | Status: AC
  Administered 2021-05-06: 5 mL
  Filled 2021-05-06: qty 5

## 2021-05-06 MED ORDER — HYDROCODONE-ACETAMINOPHEN 5-325 MG PO TABS: 1.0000 | ORAL_TABLET | Freq: Four times a day (QID) | ORAL | 0 refills | Status: AC | PRN

## 2021-05-06 NOTE — ED Triage Notes (Signed)
Pt reports that a few days ago she was in the pool and began to get some pain in it. She elevated it and rested it, but the pain got worse and it began to get redness, swelling in the 3rd toe on her left foot. She went to urgent care and they gave her antibiotics and told her to go to the ED if it changed colors.

## 2021-05-06 NOTE — Discharge Instructions (Addendum)
Please follow-up with Dr. Sherryle Lis of podiatry on Monday at 8:30 AM in his clinic as listed above.  Please return to the ER if you begin having bleeding, fevers, feel your infection is spreading, noticed increasing redness swelling severe pain, any blue-black coloration changes, or other new concerns arise.  Please continue doxycycline.  Follow-up with your anticoagulation clinic as you have already reached out.  Do not use tramadol or your muscle relaxant within 8 hours of hydrocodone use.

## 2021-05-06 NOTE — ED Provider Notes (Signed)
[facility] Emergency Department Provider Note   ____________________________________________   Event Date/Time   First MD Initiated Contact with Patient 05/06/21 1424     (approximate)  I have reviewed the triage vital signs and the nursing notes.   HISTORY  Chief Complaint Toe Injury    HPI Mary Lutz is a 55 y.o. female here for evaluation of left third toe pain  Wednesday she noticed that she had some tenderness around the base of her left toenail of the left third foot.  It worsened over time is developed redness and she reports she actually had streaking redness going up her left calf and also across the top of her left foot yesterday.  She went to urgent care was started on doxycycline.  She is also noticed that there is white possibly pus around the nail of the left third toe.  The area is painful and tender.  She did ice the foot the entirety of the night, and when she woke up this morning she felt like a tingly pins-and-needles sensation in the second toe of the left foot but still able to move it.  At this point I did advise her of the risk of nerve injury if she is to ice something greater than 10 to 15 minutes, patient was unaware of that.  She comes for ongoing pain in this area and concern for pus under the nail  Past Medical History:  Diagnosis Date   CHF (congestive heart failure) (HCC)    COPD (chronic obstructive pulmonary disease) ([place])    DVT (deep venous thrombosis) ([place]) 2010   Hypertension    MI (myocardial infarction) ([place])    x3; admissions here and [facility]. Patient reports valvular heart disease; [facility] records reference CAD as well    Patient Active Problem List   Diagnosis Date Noted   Chest pain 03/18/2019    Past Surgical History:  Procedure Laterality Date   CARDIAC CATHETERIZATION     no stents    Prior to Admission medications   Medication Sig Start Date End Date Taking? Authorizing Provider   HYDROcodone-acetaminophen (NORCO/VICODIN) 5-325 MG tablet Take 1 tablet by mouth every 6 (six) hours as needed for up to 3 days for moderate pain or severe pain. 05/06/21 05/09/21 Yes Delman Kitten, MD  acetaminophen (TYLENOL) 325 MG tablet Take 2 tablets (650 mg total) by mouth every 6 (six) hours as needed for mild pain (or Fever >/= 101). 03/19/19   Gouru, Illene Silver, MD  albuterol (VENTOLIN HFA) 108 (90 Base) MCG/ACT inhaler Inhale 2 puffs into the lungs every 4 (four) hours as needed for wheezing or shortness of breath. 12/18/18   [provider]  ANORO ELLIPTA 62.5-25 MCG/INH AEPB Inhale 1 puff into the lungs daily. 12/22/18   [provider]  diclofenac Sodium (VOLTAREN) 1 % GEL Apply 2 g topically 4 (four) times daily. 10/29/19   Carrie Mew, MD  doxycycline (VIBRAMYCIN) 100 MG capsule Take 1 capsule (100 mg total) by mouth 2 (two) times daily. 05/05/21   Verda Cumins, MD  Fluticasone-Umeclidin-Vilant (TRELEGY ELLIPTA) 100-62.5-25 MCG/INH AEPB  06/30/20   [provider]  ipratropium-albuterol (DUONEB) 0.5-2.5 (3) MG/3ML SOLN Inhale 3 mLs into the lungs 4 (four) times daily as needed for wheezing or cough. 10/08/18   [provider]  lidocaine (LIDODERM) 5 % Place 1 patch onto the skin every 12 (twelve) hours. Remove & Discard patch within 12 hours or as directed by MD 10/29/19   Joni Fears,  Doren Custard, MD  lisinopril (ZESTRIL) 10 MG tablet Take 10 mg by mouth at bedtime.  12/22/18   [provider]  medroxyPROGESTERone (PROVERA) 10 MG tablet Take 10 mg by mouth at bedtime.  02/05/18   [provider]  methocarbamol (ROBAXIN) 750 MG tablet  01/27/21   [provider]  nicotine (NICODERM CQ - DOSED IN MG/24 HOURS) 14 mg/24hr patch Place 1 patch (14 mg total) onto the skin daily. 03/20/19   Gouru, Illene Silver, MD  predniSONE (STERAPRED UNI-PAK 21 TAB) 10 MG (21) TBPK tablet 6 tablets on day 1, then 5 tablets on day 2, then 4 tablets on day 3, then 3 tablets  on day 4, then 2 tablets on day 5, then 1 tablet on day 6. 10/29/19   Carrie Mew, MD  sertraline (ZOLOFT) 100 MG tablet Take 100 mg by mouth daily. 12/04/18   [provider]  traMADol (ULTRAM) 50 MG tablet Take 50 mg by mouth daily as needed. 01/27/21   [provider]  warfarin (COUMADIN) 5 MG tablet Take 5-7.5 mg by mouth at bedtime. Take 7.'5mg'$  on Friday. Take '5mg'$  on all other days.    [provider]    Allergies Influenza vaccines, Morphine and related, Pneumococcal polysaccharide vaccine, and Toradol [ketorolac tromethamine]  Family History  Problem Relation Age of Onset   Diabetes Mellitus II Mother    Diabetes Mellitus II Father    Heart failure Father    Valvular heart disease Father    Hypertension Sister     Social History Social History   Tobacco Use   Smoking status: Some Days    Types: Cigarettes   Smokeless tobacco: Never  Vaping Use   Vaping Use: Never used  Substance Use Topics   Alcohol use: No   Drug use: No    Review of Systems Constitutional: No fever/chills Cardiovascular: Denies chest pain. Respiratory: Denies shortness of breath. Gastrointestinal: No abdominal pain.   Genitourinary: Negative for dysuria. Musculoskeletal: Negative for back pain.  Pain along the sole of the left foot, also some across the top of the left foot.  The redness she had seen actually has regressed, she shows me the line where they had outlined her area of redness yesterday and this has improved with only redness limited to the left third toe itself which is mildly swollen Skin: Negative for rash. Neurological: Negative for headaches, areas of focal weakness or numbness.    ____________________________________________   PHYSICAL EXAM:  VITAL SIGNS: ED Triage Vitals [05/06/21 1242]  Enc Vitals Group     BP (!) 155/92     Pulse Rate 100     Resp 16     Temp 98 F (36.7 C)     Temp Source Oral     SpO2 100 %     Weight (!) 310 lb  (140.6 kg)     Height '5\' 7"'$  (1.702 m)     Head Circumference      Peak Flow      Pain Score 9     Pain Loc      Pain Edu?      Excl. in [place]?     Constitutional: Alert and oriented. Well appearing and in no acute distress.  She is very pleasant.  Husband pleasant and service dog present. Eyes: Conjunctivae are normal. Head: Atraumatic. Nose: No congestion/rhinnorhea. Mouth/Throat: Mucous membranes are moist. Neck: No stridor.  Cardiovascular: Normal rate, regular rhythm.  Strong left dorsalis pedis pulse.  Capillary refill  is normal in all toes of the left foot.  Respiratory: Normal respiratory effort.  No retractions. Gastrointestinal: Soft and nontender. No distention. Musculoskeletal: Left lower extremity atraumatic.  No erythema or streaking redness noted on the left leg.  No tenderness in the calf.  Patient does endorse some tenderness across the volar and volar surfaces of the left foot.  Able to plantar and dorsiflex and wiggle toes without difficulty except for pain in the left middle toe.  The left middle toe is mildly edematous slightly more so across the volar surface, there is purulence under the skin at the left nailbed consistent with paronychia.  There is no distal ischemia or necrosis of the left toe and capillary refill is normal.  Strong dorsalis pedis left foot.  No evidence of ischemia there is no notable erythema noted across the left foot at this point, only limited to the left toe at this time. Neurologic:  Normal speech and language. No gross focal neurologic deficits are appreciated.  Skin:  Skin is warm, dry and intact. No rash noted. Psychiatric: Mood and affect are normal. Speech and behavior are normal.  ____________________________________________   LABS (all labs ordered are listed, but only abnormal results are displayed)  Labs Reviewed  CBC WITH DIFFERENTIAL/PLATELET - Abnormal; Notable for the following components:      Result Value   WBC 12.8 (*)     Hemoglobin 15.1 (*)    Platelets 407 (*)    Neutro Abs 8.5 (*)    All other components within normal limits  BASIC METABOLIC PANEL - Abnormal; Notable for the following components:   Glucose, Bld 128 (*)    Calcium 8.7 (*)    All other components within normal limits   ____________________________________________  EKG   ____________________________________________  RADIOLOGY   ____________________________________________   PROCEDURES  Procedure(s) performed:  I/d of paronychia  .Marland KitchenIncision and Drainage  Date/Time: 05/06/2021 4:26 PM Performed by: Delman Kitten, MD Authorized by: Delman Kitten, MD   Consent:    Consent obtained:  Verbal   Consent given by:  Patient   Risks, benefits, and alternatives were discussed: yes     Risks discussed:  Bleeding, incomplete drainage, pain and infection   Alternatives discussed:  Delayed treatment and observation Universal protocol:    Procedure explained and questions answered to patient or proxy's satisfaction: yes     Relevant documents present and verified: yes     Test results available : yes     Site/side marked: yes     Immediately prior to procedure, a time out was called: yes     Patient identity confirmed:  Verbally with patient, hospital-assigned identification number and arm band Location:    Type:  Abscess   Size:  Left 3rd toe paronychia   Location:  Lower extremity   Lower extremity location:  Foot   Foot location:  L foot Pre-procedure details:    Skin preparation:  Povidone-iodine Sedation:    Sedation type:  None Anesthesia:    Anesthesia method:  Local infiltration   Local anesthetic:  Lidocaine 1% w/o epi (1 cc) Procedure type:    Complexity:  Simple Procedure details:    Ultrasound guidance: no     Needle aspiration: no     Incision types:  Stab incision   Wound management:  Extensive cleaning   Drainage:  Purulent   Drainage amount:  Scant   Wound treatment:  Wound left open   Packing materials:   None Post-procedure details:  Procedure completion:  Tolerated well, no immediate complications  Critical Care performed: No  ____________________________________________   INITIAL IMPRESSION / ASSESSMENT AND PLAN / ED COURSE  Pertinent labs & imaging results that were available during my care of the patient were reviewed by me and considered in my medical decision making (see chart for details).   Left foot paronychia.  Patient now on doxycycline and her erythema and cellulitic changes seem to have progressed.  Denies systemic symptoms.  Exam overall reassuring some tenderness to the lower foot, but no evidence of ischemia or necrosis.  She does have paronychia, and discussed with the patient and this was drained successfully.  She tolerated well without bleeding, purulence was expressible and appears fully drained.  Bandaged, placed in hard soled postop shoe.  Discussed pain control options as the pain is fairly significant she reports, she reports good use of hydrocodone in the past despite her morphine allergy.  I will prescribe hydrocodone and she is agreeable not to take her tramadol or muscle relaxant while taking this medication.  Discussed case with podiatry Dr. Sherryle Lis, reviewed clinical history evaluation today and treatment.  He advises continuation of doxycycline and to follow-up in his clinic, he made appointment for the patient Monday at 73 which I discussed with the patient and she is agreeable to go.  ----------------------------------------- 4:29 PM on 05/06/2021 -----------------------------------------    Return precautions and treatment recommendations and follow-up discussed with the patient who is agreeable with the plan.        ____________________________________________   FINAL CLINICAL IMPRESSION(S) / ED DIAGNOSES  Final diagnoses:  Paronychia of third toe of left foot        Note:  This document was prepared using Dragon voice recognition  software and may include unintentional dictation errors       Delman Kitten, MD 05/06/21 1635

## 2021-05-06 NOTE — ED Notes (Signed)
Pts 3rd toe on the left foot is red at the top of it. No swelling or streaking seen on her foot.

## 2021-05-08 ENCOUNTER — Other Ambulatory Visit: Payer: Self-pay

## 2021-05-08 ENCOUNTER — Ambulatory Visit (INDEPENDENT_AMBULATORY_CARE_PROVIDER_SITE_OTHER): Admitting: Podiatry

## 2021-05-08 ENCOUNTER — Encounter: Payer: Self-pay | Admitting: Podiatry

## 2021-05-08 DIAGNOSIS — L03032 Cellulitis of left toe: Secondary | ICD-10-CM

## 2021-05-08 MED ORDER — MUPIROCIN 2 % EX OINT: 1.0000 "application " | TOPICAL_OINTMENT | Freq: Two times a day (BID) | CUTANEOUS | 2 refills | Status: AC

## 2021-05-08 NOTE — Progress Notes (Signed)
  Subjective:  Patient ID: Mary Lutz, female    DOB: 13-Mar-1966,  MRN: [id number]  Chief Complaint  Patient presents with   Nail Problem      ER follow-up: toenail infection left 14rd    55 y.o. female presents with the above complaint. History confirmed with patient.  She first went to urgent care for this and then did not improve went to the [facility] regional ER.  They performed an I&D and put her on doxycycline.  She notes improvement since starting the doxycycline.  The toe still tender.  She is on warfarin she has a procoagulant disorder and history of DVTs  Objective:  Physical Exam: warm, good capillary refill, no trophic changes or ulcerative lesions, normal DP and PT pulses, and normal sensory exam. Left Foot: Third toe paronychia erythematous to the level of the DIPJ it is tender to touch Assessment:   1. Paronychia of third toe, left      Plan:  Patient was evaluated and treated and all questions answered.  Overall she says she has had some improvement.  I think she needs more time on the antibiotics.  We discussed removal of the toenail plate to help this along.  She would like to wait and see how this does with [place] more days of antibiotics and I will see her next week for reevaluation.  I also recommended Betadine or Epsom salt soaks with daily application of mupirocin ointment.  I will see her back in 1 week for follow-up if does not improve by the time recommend avulsion of the nail plate.  Return in about 9 days (around 05/17/2021) for re-check nail infection left 3rd toe .

## 2021-05-08 NOTE — Patient Instructions (Signed)
Soak Instructions      Place 1/4 cup of epsom salts (or betadine, or white vinegar) in a quart of warm tap water.  Submerge your foot or feet with outer bandage intact for the initial soak; this will allow the bandage to become moist and wet for easy lift off.  Once you remove your bandage, continue to soak in the solution for 20 minutes.  This soak should be done twice a day.  Next, remove your foot or feet from solution, blot dry the affected area and cover.  You may use a band aid large enough to cover the area or use gauze and tape.  Apply other medications to the area as directed by the doctor such as polysporin neosporin.  IF YOUR SKIN BECOMES IRRITATED WHILE USING THESE INSTRUCTIONS, IT IS OKAY TO SWITCH TO  WHITE VINEGAR AND WATER. Or you may use antibacterial soap and water to keep the toe clean  Monitor for any signs/symptoms of infection. Call the office immediately if any occur or go directly to the emergency room. Call with any questions/concerns.      

## 2021-05-17 ENCOUNTER — Ambulatory Visit (INDEPENDENT_AMBULATORY_CARE_PROVIDER_SITE_OTHER)

## 2021-05-17 ENCOUNTER — Ambulatory Visit (INDEPENDENT_AMBULATORY_CARE_PROVIDER_SITE_OTHER): Admitting: Podiatry

## 2021-05-17 ENCOUNTER — Other Ambulatory Visit: Payer: Self-pay

## 2021-05-17 DIAGNOSIS — L03032 Cellulitis of left toe: Secondary | ICD-10-CM | POA: Diagnosis not present

## 2021-05-17 DIAGNOSIS — S90222D Contusion of left lesser toe(s) with damage to nail, subsequent encounter: Secondary | ICD-10-CM

## 2021-05-17 MED ORDER — DOXYCYCLINE HYCLATE 100 MG PO TABS: 100.0000 mg | ORAL_TABLET | Freq: Two times a day (BID) | ORAL | 0 refills | Status: AC

## 2021-05-22 NOTE — Progress Notes (Signed)
  Subjective:  Patient ID: Mary Lutz, female    DOB: January 21, 1966,  MRN: [id number]  Chief Complaint  Patient presents with   Ingrown Toenail    9 days re-check nail infection left 3rd toe     55 y.o. female returns with the above complaint. History confirmed with patient.  Feel somewhat better but is still tender  Objective:  Physical Exam: warm, good capillary refill, no trophic changes or ulcerative lesions, normal DP and PT pulses, and normal sensory exam. Left Foot: Still some tenderness around the third toe nail fold  Multiple view radiographs taken there is no evidence of fracture Assessment:   1. Contusion of lesser toe of left foot with damage to nail, subsequent encounter   2. Paronychia of third toe, left      Plan:  Patient was evaluated and treated and all questions answered.  Overall she is improved again.  I prescribed her another course of antibiotics.  If still symptomatic at all at next visit would recommend nail avulsion.  No evidence of fracture on x-rays  Return in about 2 weeks (around 05/31/2021) for check nail, possible removal .

## 2021-05-31 ENCOUNTER — Other Ambulatory Visit: Payer: Self-pay

## 2021-05-31 ENCOUNTER — Ambulatory Visit (INDEPENDENT_AMBULATORY_CARE_PROVIDER_SITE_OTHER): Admitting: Podiatry

## 2021-05-31 DIAGNOSIS — L03032 Cellulitis of left toe: Secondary | ICD-10-CM | POA: Diagnosis not present

## 2021-05-31 NOTE — Progress Notes (Signed)
  Subjective:  Patient ID: Mary Lutz, female    DOB: 1965/12/26,  MRN: [id number]  Chief Complaint  Patient presents with   Nail Problem     follow up 2 weeks for check nail, possible removal     55 y.o. female returns with the above complaint. History confirmed with patient.  Doing much better now  Objective:  Physical Exam: warm, good capillary refill, no trophic changes or ulcerative lesions, normal DP and PT pulses, and normal sensory exam. Left Foot: Minimal tenderness there is prominence of the lateral nail fold  Multiple view radiographs taken there is no evidence of fracture Assessment:   1. Paronychia of third toe, left      Plan:  Patient was evaluated and treated and all questions answered.  Doing well and that she has completed the antibiotics again no signs of infection I think this is resolved.  Is debrided the lateral portion of the nail to prevent an ingrown and she will return to see me as needed for this or other issues  Return if symptoms worsen or fail to improve.

## 2022-01-23 ENCOUNTER — Encounter: Payer: Self-pay | Admitting: Emergency Medicine

## 2022-01-23 ENCOUNTER — Emergency Department

## 2022-01-23 DIAGNOSIS — R42 Dizziness and giddiness: Secondary | ICD-10-CM | POA: Diagnosis present

## 2022-01-23 DIAGNOSIS — R072 Precordial pain: Secondary | ICD-10-CM | POA: Diagnosis not present

## 2022-01-23 DIAGNOSIS — Z5321 Procedure and treatment not carried out due to patient leaving prior to being seen by health care provider: Secondary | ICD-10-CM | POA: Diagnosis not present

## 2022-01-23 DIAGNOSIS — R61 Generalized hyperhidrosis: Secondary | ICD-10-CM | POA: Insufficient documentation

## 2022-01-23 LAB — BASIC METABOLIC PANEL
Anion gap: 8 (ref 5–15)
BUN: 12 mg/dL (ref 6–20)
CO2: 24 mmol/L (ref 22–32)
Calcium: 8.9 mg/dL (ref 8.9–10.3)
Chloride: 106 mmol/L (ref 98–111)
Creatinine, Ser: 0.73 mg/dL (ref 0.44–1.00)
GFR, Estimated: >60 mL/min (ref >60)
Glucose, Bld: 110 mg/dL — ABNORMAL HIGH (ref 70–99)
Potassium: 4.1 mmol/L (ref 3.5–5.1)
Sodium: 138 mmol/L (ref 135–145)

## 2022-01-23 LAB — CBC
HCT: 47 % — ABNORMAL HIGH (ref 36.0–46.0)
Hemoglobin: 15.3 g/dL — ABNORMAL HIGH (ref 12.0–15.0)
MCH: 30.6 pg (ref 26.0–34.0)
MCHC: 32.6 g/dL (ref 30.0–36.0)
MCV: 94 fL (ref 80.0–100.0)
Platelets: 401 10*3/uL — ABNORMAL HIGH (ref 150–400)
RBC: 5 MIL/uL (ref 3.87–5.11)
RDW: 14.6 % (ref 11.5–15.5)
WBC: 12.5 10*3/uL — ABNORMAL HIGH (ref 4.0–10.5)
nRBC: 0 % (ref 0.0–0.2)

## 2022-01-23 LAB — D-DIMER, QUANTITATIVE: D-Dimer, Quant: <0.27 ug/mL-FEU (ref 0.00–0.50)

## 2022-01-23 LAB — PROTIME-INR
INR: 1.8 — ABNORMAL HIGH (ref 0.8–1.2)
Prothrombin Time: 20.9 seconds — ABNORMAL HIGH (ref 11.4–15.2)

## 2022-01-23 LAB — APTT: aPTT: 38 seconds — ABNORMAL HIGH (ref 24–36)

## 2022-01-23 LAB — TROPONIN I (HIGH SENSITIVITY): Troponin I (High Sensitivity): 5 ng/L (ref <18)

## 2022-01-23 NOTE — ED Triage Notes (Signed)
Pt presents via EMS with complaints of dizziness with associated midsternal CP that started 30 minutes ago. She notes being diaphoretic and clammy PTA. She notes receiving '324mg'$  ASA & 1inch of nitro paste by EMS and she notes that helped her pain minimally. EKG unremarkable with EMS. Denies SOB. ?

## 2022-01-24 ENCOUNTER — Emergency Department
Admission: EM | Admit: 2022-01-24 | Discharge: 2022-01-24 | Discharge status: Against Medical Advice | Attending: Emergency Medicine | Admitting: Emergency Medicine

## 2022-06-15 ENCOUNTER — Emergency Department
Admission: EM | Admit: 2022-06-15 | Discharge: 2022-06-15 | Disposition: A | Discharge status: Home / Self Care | Payer: 59 | Attending: Emergency Medicine | Admitting: Emergency Medicine

## 2022-06-15 ENCOUNTER — Other Ambulatory Visit: Payer: Self-pay

## 2022-06-15 ENCOUNTER — Emergency Department: Payer: 59

## 2022-06-15 DIAGNOSIS — M542 Cervicalgia: Secondary | ICD-10-CM | POA: Insufficient documentation

## 2022-06-15 DIAGNOSIS — I773 Arterial fibromuscular dysplasia: Secondary | ICD-10-CM | POA: Insufficient documentation

## 2022-06-15 DIAGNOSIS — R519 Headache, unspecified: Secondary | ICD-10-CM | POA: Diagnosis not present

## 2022-06-15 DIAGNOSIS — Z7901 Long term (current) use of anticoagulants: Secondary | ICD-10-CM | POA: Diagnosis not present

## 2022-06-15 LAB — BASIC METABOLIC PANEL
Anion gap: 7 (ref 5–15)
BUN: 9 mg/dL (ref 6–20)
CO2: 23 mmol/L (ref 22–32)
Calcium: 8.5 mg/dL — ABNORMAL LOW (ref 8.9–10.3)
Chloride: 106 mmol/L (ref 98–111)
Creatinine, Ser: 0.77 mg/dL (ref 0.44–1.00)
GFR, Estimated: >60 mL/min (ref >60)
Glucose, Bld: 86 mg/dL (ref 70–99)
Potassium: 3.7 mmol/L (ref 3.5–5.1)
Sodium: 136 mmol/L (ref 135–145)

## 2022-06-15 LAB — I-STAT CREATININE, ED: Creatinine, Ser: 0.7 mg/dL (ref 0.44–1.00)

## 2022-06-15 LAB — CBC WITH DIFFERENTIAL/PLATELET
Abs Immature Granulocytes: 0.07 10*3/uL (ref 0.00–0.07)
Basophils Absolute: 0.1 10*3/uL (ref 0.0–0.1)
Basophils Relative: 0 %
Eosinophils Absolute: 0.2 10*3/uL (ref 0.0–0.5)
Eosinophils Relative: 1 %
HCT: 47.1 % — ABNORMAL HIGH (ref 36.0–46.0)
Hemoglobin: 15.4 g/dL — ABNORMAL HIGH (ref 12.0–15.0)
Immature Granulocytes: 0 %
Lymphocytes Relative: 19 %
Lymphs Abs: 3.1 10*3/uL (ref 0.7–4.0)
MCH: 30.6 pg (ref 26.0–34.0)
MCHC: 32.7 g/dL (ref 30.0–36.0)
MCV: 93.5 fL (ref 80.0–100.0)
Monocytes Absolute: 1.2 10*3/uL — ABNORMAL HIGH (ref 0.1–1.0)
Monocytes Relative: 8 %
Neutro Abs: 11.4 10*3/uL — ABNORMAL HIGH (ref 1.7–7.7)
Neutrophils Relative %: 72 %
Platelets: 391 10*3/uL (ref 150–400)
RBC: 5.04 MIL/uL (ref 3.87–5.11)
RDW: 13.5 % (ref 11.5–15.5)
WBC: 16 10*3/uL — ABNORMAL HIGH (ref 4.0–10.5)
nRBC: 0 % (ref 0.0–0.2)

## 2022-06-15 MED ORDER — FENTANYL CITRATE PF 50 MCG/ML IJ SOSY
50.0000 ug | PREFILLED_SYRINGE | Freq: Once | INTRAMUSCULAR | Status: AC
  Administered 2022-06-15: 50 ug via INTRAVENOUS
  Filled 2022-06-15: qty 1

## 2022-06-15 MED ORDER — OXYCODONE-ACETAMINOPHEN 5-325 MG PO TABS
1.0000 | ORAL_TABLET | Freq: Once | ORAL | Status: AC
  Administered 2022-06-15: 1 via ORAL
  Filled 2022-06-15: qty 1

## 2022-06-15 MED ORDER — BACLOFEN 10 MG PO TABS: 10.0000 mg | ORAL_TABLET | Freq: Three times a day (TID) | ORAL | 0 refills | Status: AC

## 2022-06-15 MED ORDER — CYCLOBENZAPRINE HCL 10 MG PO TABS
10.0000 mg | ORAL_TABLET | Freq: Once | ORAL | Status: AC
  Administered 2022-06-15: 10 mg via ORAL
  Filled 2022-06-15: qty 1

## 2022-06-15 MED ORDER — SODIUM CHLORIDE 0.9 % IV BOLUS
500.0000 mL | Freq: Once | INTRAVENOUS | Status: AC
Start: 2022-06-15 — End: 2022-06-15
  Administered 2022-06-15: 500 mL via INTRAVENOUS

## 2022-06-15 MED ORDER — HYDROMORPHONE HCL 1 MG/ML IJ SOLN
1.0000 mg | Freq: Once | INTRAMUSCULAR | Status: AC
  Administered 2022-06-15: 1 mg via INTRAVENOUS
  Filled 2022-06-15: qty 1

## 2022-06-15 MED ORDER — METHYLPREDNISOLONE 4 MG PO TBPK: ORAL_TABLET | ORAL | 0 refills | Status: AC

## 2022-06-15 MED ORDER — IOHEXOL 350 MG/ML SOLN
75.0000 mL | Freq: Once | INTRAVENOUS | Status: AC | PRN
  Administered 2022-06-15: 75 mL via INTRAVENOUS

## 2022-06-15 MED ORDER — HYDROCODONE-ACETAMINOPHEN 5-325 MG PO TABS: 1.0000 | ORAL_TABLET | Freq: Four times a day (QID) | ORAL | 0 refills | Status: AC | PRN

## 2022-06-15 NOTE — ED Provider Notes (Signed)
[facility] Provider Note    Event Date/Time   First MD Initiated Contact with Patient 06/15/22 1151     (approximate)   History   Muscle Pain   HPI  Mary Lutz is a 56 y.o. female with history of DVT, chronic low back pain presents emergency department complaining of neck pain.  Patient states she has been unable to turn the neck.  Has some tenderness and swelling posteriorly.  No fever or chills.  Patient recently switched off of warfarin and started taking Eliquis today.  She denies numbness or tingling.  States she took her tramadol, Robaxin, and used a lidocaine patch without any relief.      Physical Exam   Triage Vital Signs: ED Triage Vitals  Enc Vitals Group     BP 06/15/22 1106 (!) 167/94     Pulse Rate 06/15/22 1106 98     Resp 06/15/22 1106 20     Temp 06/15/22 1106 97.9 F (36.6 C)     Temp Source 06/15/22 1106 Oral     SpO2 06/15/22 1106 97 %     Weight 06/15/22 1112 (!) 345 lb (156.5 kg)     Height 06/15/22 1112 '5\' 7"'$  (1.702 m)     Head Circumference --      Peak Flow --      Pain Score 06/15/22 1112 8     Pain Loc --      Pain Edu? --      Excl. in [place]? --     Most recent vital signs: Vitals:   06/15/22 1106  BP: (!) 167/94  Pulse: 98  Resp: 20  Temp: 97.9 F (36.6 C)  SpO2: 97%     General: Awake, no distress.   CV:  Good peripheral perfusion. regular rate and  rhythm Resp:  Normal effort.  Abd:  No distention.   Other:  C-spine with posterior swelling, area is tender to palpation, grips are equal bilaterally, decreased range of motion with rotation to the left and right   ED Results / Procedures / Treatments   Labs (all labs ordered are listed, but only abnormal results are displayed) Labs Reviewed  CBC WITH DIFFERENTIAL/PLATELET - Abnormal; Notable for the following components:      Result Value   WBC 16.0 (*)    Hemoglobin 15.4 (*)    HCT 47.1 (*)    Neutro Abs 11.4 (*)    Monocytes Absolute 1.2  (*)    All other components within normal limits  BASIC METABOLIC PANEL - Abnormal; Notable for the following components:   Calcium 8.5 (*)    All other components within normal limits  I-STAT CREATININE, ED     EKG     RADIOLOGY X-ray of the C-spine    PROCEDURES:   Procedures   MEDICATIONS ORDERED IN ED: Medications  oxyCODONE-acetaminophen (PERCOCET/ROXICET) 5-325 MG per tablet 1 tablet (1 tablet Oral Given 06/15/22 1213)  sodium chloride 0.9 % bolus 500 mL (0 mLs Intravenous Stopped 06/15/22 1610)  fentaNYL (SUBLIMAZE) injection 50 mcg (50 mcg Intravenous Given 06/15/22 1353)  cyclobenzaprine (FLEXERIL) tablet 10 mg (10 mg Oral Given 06/15/22 1424)  iohexol (OMNIPAQUE) 350 MG/ML injection 75 mL (75 mLs Intravenous Contrast Given 06/15/22 1514)  HYDROmorphone (DILAUDID) injection 1 mg (1 mg Intravenous Given 06/15/22 1638)     IMPRESSION / MDM / [place] / ED COURSE  I reviewed the triage vital signs and the nursing notes.  Differential diagnosis includes, but is not limited to, muscle spasm, herniated disc, cancerous lesion, radiculopathy  Patient's presentation is most consistent with acute complicated illness / injury requiring diagnostic workup.   We will do x-ray of the C-spine, patient be given Percocet while here in the ED.   X-ray of the C-spine independently reviewed and interpreted by me as negative.  Patient continues to have pain that is not controlled by oral medication.  Gave her fentanyl 50 mcg, normal saline 500 mL, and a Flexeril p.o.  CTA due to the patient's past medical history of fibromuscular dysplasia of the artery and with family history of dissection.  CTA independently reviewed and interpreted by me is negative for any acute abnormality.  She was given Dilaudid 1 mg IM as her IV had clotted off  She is to follow-up with her regular doctor.  Explained to her that CT results show a chronic change.   She is to return emergency department if worsening.  She was given a prescription for Medrol Dosepak, baclofen, and hydrocodone.  She is to stop taking the tramadol while on this medication and stop taking her Robaxin while on this medication.  States she understands.  She was discharged in stable condition.   FINAL CLINICAL IMPRESSION(S) / ED DIAGNOSES   Final diagnoses:  Fibromuscular dysplasia (HCC)  Acute neck pain     Rx / DC Orders   ED Discharge Orders          Ordered    methylPREDNISolone (MEDROL DOSEPAK) 4 MG TBPK tablet        06/15/22 1657    baclofen (LIORESAL) 10 MG tablet  3 times daily        06/15/22 1657    HYDROcodone-acetaminophen (NORCO/VICODIN) 5-325 MG tablet  Every 6 hours PRN        06/15/22 1657             Note:  This document was prepared using Dragon voice recognition software and may include unintentional dictation errors.    Versie Starks, PA-C 06/15/22 1701    Nena Polio, MD 06/16/22 [phone]

## 2022-06-15 NOTE — ED Triage Notes (Signed)
Pt c/o muscle knot/pain at base of head/neck on right side x2 days that is relieved with manuel pressure. Pt denies any injury or malposition during sleep. Pt denies headache, blurry vision. Pt reports taking OTC meds, tramadol, muscle relaxer's and lidocaine patch -currently on- without relief of s/s. Pt is AOX4, in NAD.

## 2022-06-15 NOTE — Discharge Instructions (Signed)
Follow-up with your regular doctor if not improving 3 days.  Return emergency department worsening.  Use your medication as prescribed. Do not take the hydrocodone with your tramadol.  In do not take the baclofen with the methocarbamol. Apply ice to the neck.

## 2022-06-23 ENCOUNTER — Encounter: Payer: Self-pay | Admitting: Emergency Medicine

## 2022-06-23 ENCOUNTER — Emergency Department
Admission: EM | Admit: 2022-06-23 | Discharge: 2022-06-23 | Disposition: A | Discharge status: Home / Self Care | Payer: 59 | Attending: Emergency Medicine | Admitting: Emergency Medicine

## 2022-06-23 ENCOUNTER — Other Ambulatory Visit: Payer: Self-pay

## 2022-06-23 ENCOUNTER — Emergency Department: Payer: 59

## 2022-06-23 DIAGNOSIS — R202 Paresthesia of skin: Secondary | ICD-10-CM | POA: Diagnosis not present

## 2022-06-23 DIAGNOSIS — R519 Headache, unspecified: Secondary | ICD-10-CM | POA: Insufficient documentation

## 2022-06-23 DIAGNOSIS — R200 Anesthesia of skin: Secondary | ICD-10-CM | POA: Diagnosis present

## 2022-06-23 LAB — CBC
HCT: 46.5 % — ABNORMAL HIGH (ref 36.0–46.0)
Hemoglobin: 15 g/dL (ref 12.0–15.0)
MCH: 30.9 pg (ref 26.0–34.0)
MCHC: 32.3 g/dL (ref 30.0–36.0)
MCV: 95.9 fL (ref 80.0–100.0)
Platelets: 365 10*3/uL (ref 150–400)
RBC: 4.85 MIL/uL (ref 3.87–5.11)
RDW: 13.7 % (ref 11.5–15.5)
WBC: 18.7 10*3/uL — ABNORMAL HIGH (ref 4.0–10.5)
nRBC: 0 % (ref 0.0–0.2)

## 2022-06-23 LAB — COMPREHENSIVE METABOLIC PANEL
ALT: 11 U/L (ref 0–44)
AST: 18 U/L (ref 15–41)
Albumin: 3.8 g/dL (ref 3.5–5.0)
Alkaline Phosphatase: 69 U/L (ref 38–126)
Anion gap: 9 (ref 5–15)
BUN: 15 mg/dL (ref 6–20)
CO2: 23 mmol/L (ref 22–32)
Calcium: 8.8 mg/dL — ABNORMAL LOW (ref 8.9–10.3)
Chloride: 108 mmol/L (ref 98–111)
Creatinine, Ser: 0.69 mg/dL (ref 0.44–1.00)
GFR, Estimated: >60 mL/min (ref >60)
Glucose, Bld: 92 mg/dL (ref 70–99)
Potassium: 3.5 mmol/L (ref 3.5–5.1)
Sodium: 140 mmol/L (ref 135–145)
Total Bilirubin: 0.7 mg/dL (ref 0.3–1.2)
Total Protein: 7.5 g/dL (ref 6.5–8.1)

## 2022-06-23 LAB — DIFFERENTIAL
Abs Immature Granulocytes: 0.08 10*3/uL — ABNORMAL HIGH (ref 0.00–0.07)
Basophils Absolute: 0.1 10*3/uL (ref 0.0–0.1)
Basophils Relative: 1 %
Eosinophils Absolute: 0.4 10*3/uL (ref 0.0–0.5)
Eosinophils Relative: 2 %
Immature Granulocytes: 0 %
Lymphocytes Relative: 21 %
Lymphs Abs: 4 10*3/uL (ref 0.7–4.0)
Monocytes Absolute: 1.5 10*3/uL — ABNORMAL HIGH (ref 0.1–1.0)
Monocytes Relative: 8 %
Neutro Abs: 12.7 10*3/uL — ABNORMAL HIGH (ref 1.7–7.7)
Neutrophils Relative %: 68 %

## 2022-06-23 LAB — CBG MONITORING, ED: Glucose-Capillary: 125 mg/dL — ABNORMAL HIGH (ref 70–99)

## 2022-06-23 LAB — PROTIME-INR
INR: 1.2 (ref 0.8–1.2)
Prothrombin Time: 14.7 seconds (ref 11.4–15.2)

## 2022-06-23 LAB — PROCALCITONIN: Procalcitonin: <0.1 ng/mL

## 2022-06-23 LAB — APTT: aPTT: 29 seconds (ref 24–36)

## 2022-06-23 LAB — SEDIMENTATION RATE: Sed Rate: 26 mm/hr (ref 0–30)

## 2022-06-23 LAB — ETHANOL: Alcohol, Ethyl (B): <10 mg/dL (ref <10)

## 2022-06-23 MED ORDER — LORAZEPAM 2 MG/ML IJ SOLN
1.0000 mg | Freq: Once | INTRAMUSCULAR | Status: AC
  Administered 2022-06-23: 1 mg via INTRAVENOUS
  Filled 2022-06-23: qty 1

## 2022-06-23 MED ORDER — HYDROMORPHONE HCL 1 MG/ML IJ SOLN
0.5000 mg | Freq: Once | INTRAMUSCULAR | Status: AC
  Administered 2022-06-23: 0.5 mg via INTRAVENOUS
  Filled 2022-06-23: qty 0.5

## 2022-06-23 MED ORDER — SODIUM CHLORIDE 0.9% FLUSH
3.0000 mL | Freq: Once | INTRAVENOUS | Status: AC
  Administered 2022-06-23: 3 mL via INTRAVENOUS

## 2022-06-23 NOTE — ED Notes (Signed)
Call [facility] To activate Code Stroke

## 2022-06-23 NOTE — ED Notes (Signed)
Patient at MRI 

## 2022-06-23 NOTE — ED Notes (Signed)
Patient to Ct at this time with RN.

## 2022-06-23 NOTE — ED Notes (Signed)
Per Neuro, code stroke cancelled.

## 2022-06-23 NOTE — ED Notes (Signed)
Pt reports rt sided facial numbness/tinging has resolved.

## 2022-06-23 NOTE — ED Triage Notes (Signed)
Patient to ED for right sided facial numbness. Patient states this started around 3:50 pm. Patient states she has pain on backside of head on right side- been seen for same and had MRI on Thursday for pain. No weakness noted. Cinda Quest, MD at bedside assessing pt.

## 2022-06-23 NOTE — ED Notes (Addendum)
Pt states pain on rt side poster head is chronic pain and states that pain medication does not work.

## 2022-06-23 NOTE — Discharge Instructions (Addendum)
Dr.Bhagat, the neurologist reviewed all your studies and thinks that the MRI is okay.  The changes that I talked about she thinks are not from multiple sclerosis or anything worrisome.  She does want you to follow-up with Dr. Jaynee Eagles the neurologist.  She has put in a consult request and they should call you with an appointment in a week or 2.  Otherwise please call the office at the number that I gave you and let them know that our neurologist in the hospital had asked her to follow-up with Dr. Jaynee Eagles.  Otherwise please continue your medication.

## 2022-06-23 NOTE — Consult Note (Signed)
[facility] Telemedicine Consult   Requesting Provider: Conni Slipper Consult Participants: Myself, patient, husband, bedside nurse Location of the provider: [place] Location of the patient: [facility]  This consult was provided via telemedicine with 2-way video and audio communication. The patient/family was informed that care would be provided in this way and agreed to receive care in this manner.    CC: Headache  History is obtained from: Patient and chart review  HPI: Mary Lutz is a 56 y.o. 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  She has been taking baclofen for the past 1 week and was additionally treated with a Medrol Dosepak but switch back to Robaxin recently due to running out of the baclofen.  She has had an MRI brain with and without contrast several days ago at [facility].  I am unable to review the images but the report is as follows:  MRI brain 9/21  1. No mass lesion identified.  2. There are a few mild subcortical T2 FLAIR punctate hyperintensities  which are nonspecific but can be seen in the setting of migraine headaches  or early microvascular changes.  3. Mild flattening of the pituitary, which is nonspecific. Findings could  be seen in the setting of idiopathic intracranial hypotension in the  appropriate clinical context.   She denies any missed doses of Eliquis.  She notes that the pain improves briefly with palpation but then worsens when she like to go is the area that hurts which is in the right occipital region.  Code stroke was activated because she began to feel that her right face had funny sensation starting at 3:45 PM  LKW: 3:45 PM Thrombolytic given?: No,  on Eliquis  IA performed?: No, exam not c/w LVO Premorbid modified rankin scale: 0-1     0 - No symptoms.     1 - No significant disability. Able to carry out all usual activities, despite some symptoms.  Modified Rankin Scale: 0-Completely asymptomatic and back to baseline post- stroke Time of teleneurologist evaluation: 5:23 PM  Exam: Vitals:   06/23/22 1715 06/23/22 1812  BP: (!) 166/90 (!) 143/81  Pulse: 93 85  Resp: 18 (!) 21  Temp:    SpO2: 98% 96%    General: Flat affect, anxious, frustrated Pulmonary: breathing comfortably Cardiac: regular rate and rhythm on monitor   NIH Stroke scale 1A: Level of Consciousness - 0 1B: Ask Month and Age - 0 1C: 'Blink Eyes' & 'Squeeze Hands' - 0 2: Test Horizontal Extraocular Movements - 0 3: Test Visual Fields - 0 appears to orients to stimuli in all 4 quadrants but does not count fingers well 4: Test Facial Palsy - 0 5A: Test Left Arm Motor Drift - 1 5B: Test Right Arm Motor Drift - 1 6A: Test Left Leg Motor Drift - 1 6B: Test Right Leg Motor Drift - 1 7: Test Limb Ataxia - 0 8: Test Sensation - 1 (right face numbness) 9: Test Language/Aphasia- 0 10: Test Dysarthria - 1 (per patient report)  11: Test Extinction/Inattention - 0 NIHSS score: 5   Imaging Reviewed:   Head CT personally reviewed, agree with radiology no acute intracranial process  Labs reviewed in epic and pertinent values follow:  Basic Metabolic Panel: Recent Labs  Lab 06/23/22 1657  NA 140  K 3.5  CL 108  CO2 23  GLUCOSE 92  BUN 15  CREATININE 0.69  CALCIUM 8.8*    CBC: Recent Labs  Lab 06/23/22 1657  WBC 18.7*  NEUTROABS 12.7*  HGB 15.0  HCT 46.5*  MCV 95.9  PLT 365    Coagulation Studies: Recent Labs    06/23/22 1657  LABPROT 14.7  INR 1.2      Assessment: Overall I feel like her altered facial sensation may be related to her head pain, but given she is on Eliquis and had a sudden onset of this numbness, MRI brain to exclude  acute intracranial process and confirm safety of continuing Eliquis is reasonable.  She has had extensive work-up for this headache recently including head CT, CTA, and MRI brain with and without contrast and given that the headache has not changed I do not think any other advanced headache specific imaging is needed.  I feel that the mild encephalopathy on examination (such as when testing visual fields, slurred speech, and slight drift in all extremities) is more related to her pain and use of pain medications than a primary neurological deficit but again MRI brain will be useful in clarifying this  Recommendations:  -MRI brain without contrast -Outpatient referral to Dr. Jaynee Eagles for headache management if MRI is negative -Acute pain management per EDP -Leukocytosis may be secondary to recent steroids, infectious clearance per ED   This patient is receiving care for possible acute neurological changes. There was 60 minutes of care by this provider at the time of service, including time for direct evaluation via telemedicine, review of medical records, imaging studies and discussion of findings with providers, the patient and/or family.   Lesleigh Noe MD-PhD [facility] [phone] If 8pm-8am, please page neurology on call as listed in [place].

## 2022-06-23 NOTE — ED Notes (Signed)
Multiple attempts at IV starts with no success by multiple RN's.

## 2022-06-23 NOTE — ED Notes (Signed)
Patient back from CT at this time. Tele neuro cart at bedside with neurologist assessing patient.

## 2022-06-23 NOTE — ED Provider Notes (Signed)
[facility] Provider Note    Event Date/Time   First MD Initiated Contact with Patient 06/23/22 1648     (approximate)   History   Numbness   HPI  Mary Lutz is a 56 y.o. female who reports onset of right-sided facial numbness at about 3:50 PM.  The numbness does not involve the right forehead over the right cheekbone but below that down to the chin.  She has no other numbness or weakness or blurry vision slurry speech etc.  She has been having pain on the back of the right side of the head just to the right of the occiput.  This been going on for several days.      Physical Exam   Triage Vital Signs: ED Triage Vitals  Enc Vitals Group     BP 06/23/22 1653 136/79     Pulse Rate 06/23/22 1653 92     Resp 06/23/22 1653 18     Temp 06/23/22 1653 98 F (36.7 C)     Temp Source 06/23/22 1653 Oral     SpO2 06/23/22 1653 99 %     Weight 06/23/22 1652 228 lb 6.4 oz (103.6 kg)     Height 06/23/22 1652 '5\' 7"'$  (1.702 m)     Head Circumference --      Peak Flow --      Pain Score --      Pain Loc --      Pain Edu? --      Excl. in [place]? --     Most recent vital signs: Vitals:   06/23/22 2104 06/23/22 2130  BP:  115/78  Pulse:  89  Resp:  (!) 21  Temp: 97.9 F (36.6 C)   SpO2:  99%     General: Awake, no distress.  Head normocephalic atraumatic patient has pain as described in HPI it is worse when I let go of touching it and when I touch it.  There is about a silver dollar sized area of pain. Mouth no erythema or exudate CV:  Good peripheral perfusion.  Heart regular rate and rhythm no audible murmurs Resp:  Normal effort.  Lungs are clear Abd:  No distention.  Soft and nontender Extremities with no edema Neuro cranial nerves II through XII are intact except for the third division of the 5th cranial nerve on the right side of the face where there is some tingling.  Visual fields are intact to confrontation Cerebellar finger-nose rapid  alternating movements in the hands and heel-to-shin are all normal Motor strength is 5/5 throughout patient does not report any other numbness.   ED Results / Procedures / Treatments   Labs (all labs ordered are listed, but only abnormal results are displayed) Labs Reviewed  CBC - Abnormal; Notable for the following components:      Result Value   WBC 18.7 (*)    HCT 46.5 (*)    All other components within normal limits  DIFFERENTIAL - Abnormal; Notable for the following components:   Neutro Abs 12.7 (*)    Monocytes Absolute 1.5 (*)    Abs Immature Granulocytes 0.08 (*)    All other components within normal limits  COMPREHENSIVE METABOLIC PANEL - Abnormal; Notable for the following components:   Calcium 8.8 (*)    All other components within normal limits  CBG MONITORING, ED - Abnormal; Notable for the following components:   Glucose-Capillary 125 (*)    All other components within normal  limits  PROTIME-INR  APTT  ETHANOL  PROCALCITONIN  SEDIMENTATION RATE  PROCALCITONIN  POC URINE PREG, ED     EKG  EKG read interpreted by me shows normal sinus rhythm rate of 90 normal axis no acute ST-T changes   RADIOLOGY MRI read by radiology reviewed by neurology and reviewed by myself does not show any apparent cause for her third division of the 5th nerve numbness.   PROCEDURES:  Critical Care performed:   Procedures   MEDICATIONS ORDERED IN ED: Medications  sodium chloride flush (NS) 0.9 % injection 3 mL (3 mLs Intravenous Given 06/23/22 1752)  LORazepam (ATIVAN) injection 1 mg (1 mg Intravenous Given 06/23/22 1812)  HYDROmorphone (DILAUDID) injection 0.5 mg (0.5 mg Intravenous Given 06/23/22 2004)     IMPRESSION / MDM / [place] / ED COURSE  I reviewed the triage vital signs and the nursing notes. Discussed patient and the CT in detail with neurology.  Neurology also saw the patient.  She feels the patient can go home with outpatient follow-up.  The  little white spots on the MRI are not felt to be significant in this patient.  Patient has a neurology appointment now for a week or 2 from today.  Differential diagnosis includes, but is not limited to, peripheral neuropathy or neuritis.  CVA, vasculitis, MS or other intracranial inflammatory process.  Patient has a white count possibly from being on steroids recently.  Her sed rate is not elevated.  Her white count was with a essentially normal differential.  Procalcitonin was negative.  Patient's presentation is most consistent with acute complicated illness / injury requiring diagnostic workup.  The patient is on the cardiac monitor to evaluate for evidence of arrhythmia and/or significant heart rate changes none were seen  FINAL CLINICAL IMPRESSION(S) / ED DIAGNOSES   Final diagnoses:  Paresthesia     Rx / DC Orders   ED Discharge Orders          Ordered    Ambulatory referral to Neurology       Comments: An appointment is requested in approximately: 1-2 weeks   06/23/22 1848             Note:  This document was prepared using Dragon voice recognition software and may include unintentional dictation errors.   Nena Polio, MD 06/24/22 0005

## 2022-06-24 NOTE — ED Provider Notes (Incomplete)
[facility] Provider Note    Event Date/Time   First MD Initiated Contact with Patient 06/23/22 1648     (approximate)   History   Numbness   HPI  Mary Lutz is a 56 y.o. female who reports onset of right-sided facial numbness at about 3:50 PM.  The numbness does not involve the right forehead over the right cheekbone but below that down to the chin.  She has no other numbness or weakness or blurry vision slurry speech etc.  She has been having pain on the back of the right side of the head just to the right of the occiput.  This been going on for several days.      Physical Exam   Triage Vital Signs: ED Triage Vitals  Enc Vitals Group     BP 06/23/22 1653 136/79     Pulse Rate 06/23/22 1653 92     Resp 06/23/22 1653 18     Temp 06/23/22 1653 98 F (36.7 C)     Temp Source 06/23/22 1653 Oral     SpO2 06/23/22 1653 99 %     Weight 06/23/22 1652 228 lb 6.4 oz (103.6 kg)     Height 06/23/22 1652 '5\' 7"'$  (1.702 m)     Head Circumference --      Peak Flow --      Pain Score --      Pain Loc --      Pain Edu? --      Excl. in [place]? --     Most recent vital signs: Vitals:   06/23/22 2104 06/23/22 2130  BP:  115/78  Pulse:  89  Resp:  (!) 21  Temp: 97.9 F (36.6 C)   SpO2:  99%     General: Awake, no distress.  Head normocephalic atraumatic patient has pain as described in HPI it is worse when I let go of touching it and when I touch it.  There is about a silver dollar sized area of pain. Mouth no erythema or exudate CV:  Good peripheral perfusion.  Heart regular rate and rhythm no audible murmurs Resp:  Normal effort.  Lungs are clear Abd:  No distention.  Soft and nontender Extremities with no edema Neuro cranial nerves II through XII are intact except for the third division of the 5th cranial nerve on the right side of the face where there is some tingling.  Visual fields are intact to confrontation Cerebellar finger-nose rapid  alternating movements in the hands and heel-to-shin are all normal Motor strength is 5/5 throughout patient does not report any other numbness.   ED Results / Procedures / Treatments   Labs (all labs ordered are listed, but only abnormal results are displayed) Labs Reviewed  CBC - Abnormal; Notable for the following components:      Result Value   WBC 18.7 (*)    HCT 46.5 (*)    All other components within normal limits  DIFFERENTIAL - Abnormal; Notable for the following components:   Neutro Abs 12.7 (*)    Monocytes Absolute 1.5 (*)    Abs Immature Granulocytes 0.08 (*)    All other components within normal limits  COMPREHENSIVE METABOLIC PANEL - Abnormal; Notable for the following components:   Calcium 8.8 (*)    All other components within normal limits  CBG MONITORING, ED - Abnormal; Notable for the following components:   Glucose-Capillary 125 (*)    All other components within normal  limits  PROTIME-INR  APTT  ETHANOL  PROCALCITONIN  SEDIMENTATION RATE  PROCALCITONIN  POC URINE PREG, ED     EKG  ***   RADIOLOGY *** {USE THE WORD "INTERPRETED"!! You MUST document your own interpretation of imaging, as well as the fact that you reviewed the radiologist's report!:1}   PROCEDURES:  Critical Care performed: {CriticalCareYesNo:19197::"Yes, see critical care procedure note(s)","No"}  Procedures   MEDICATIONS ORDERED IN ED: Medications  sodium chloride flush (NS) 0.9 % injection 3 mL (3 mLs Intravenous Given 06/23/22 1752)  LORazepam (ATIVAN) injection 1 mg (1 mg Intravenous Given 06/23/22 1812)  HYDROmorphone (DILAUDID) injection 0.5 mg (0.5 mg Intravenous Given 06/23/22 2004)     IMPRESSION / MDM / [place] / ED COURSE  I reviewed the triage vital signs and the nursing notes. Discussed patient and the CT in detail with neurology.  Differential diagnosis includes, but is not limited to, ***  Patient's presentation is most consistent with acute  complicated illness / injury requiring diagnostic workup.  The patient is on the cardiac monitor to evaluate for evidence of arrhythmia and/or significant heart rate changes none were seen  FINAL CLINICAL IMPRESSION(S) / ED DIAGNOSES   Final diagnoses:  Paresthesia     Rx / DC Orders   ED Discharge Orders          Ordered    Ambulatory referral to Neurology       Comments: An appointment is requested in approximately: 1-2 weeks   06/23/22 1848             Note:  This document was prepared using Dragon voice recognition software and may include unintentional dictation errors.

## 2023-04-06 ENCOUNTER — Encounter: Payer: Self-pay | Admitting: Emergency Medicine

## 2023-04-06 ENCOUNTER — Other Ambulatory Visit: Payer: Self-pay

## 2023-04-06 ENCOUNTER — Emergency Department
Admission: EM | Admit: 2023-04-06 | Discharge: 2023-04-06 | Disposition: A | Discharge status: Home / Self Care | Payer: 59 | Attending: Emergency Medicine | Admitting: Emergency Medicine

## 2023-04-06 DIAGNOSIS — M62838 Other muscle spasm: Secondary | ICD-10-CM | POA: Diagnosis not present

## 2023-04-06 DIAGNOSIS — M542 Cervicalgia: Secondary | ICD-10-CM | POA: Diagnosis present

## 2023-04-06 DIAGNOSIS — M436 Torticollis: Secondary | ICD-10-CM

## 2023-04-06 MED ORDER — LIDOCAINE HCL (PF) 1 % IJ SOLN
10.0000 mL | Freq: Once | INTRAMUSCULAR | Status: AC
  Administered 2023-04-06: 10 mL
  Filled 2023-04-06: qty 10

## 2023-04-06 MED ORDER — LIDOCAINE 5 % EX PTCH: 1.0000 | MEDICATED_PATCH | Freq: Two times a day (BID) | CUTANEOUS | 0 refills | Status: AC

## 2023-04-06 MED ORDER — KETOROLAC TROMETHAMINE 30 MG/ML IJ SOLN
30.0000 mg | Freq: Once | INTRAMUSCULAR | Status: DC
  Filled 2023-04-06: qty 1

## 2023-04-06 NOTE — Discharge Instructions (Addendum)
Please use lidocaine patches at your site of pain.  Apply 1 patch at a time, leave on for 12 hours, then remove for 12 hours.  12 hours on, 12 hours off.  Do not apply more than 1 patch at a time.  Use the Robaxin muscle relaxer up to 3-4 times per day

## 2023-04-06 NOTE — ED Triage Notes (Signed)
Pt presents ambulatory to triage via POV with complaints of R sided neck pain/muscle spasms x 6 days. Pt was seen at [facility] and was diagnosed with Cervical paraspinal muscle spasms and was prescribed muscle relaxer's and pain meds which have been ineffective. Last took tramadol PTA, pt tearful in triage due to the pain. A&Ox4 at this time. Denies CP or SOB.

## 2023-04-06 NOTE — ED Provider Notes (Signed)
[facility] Provider Note    Event Date/Time   First MD Initiated Contact with Patient 04/06/23 [id number]     (approximate)   History   Spasms   HPI  Mary Lutz is a 57 y.o. female who presents to the ED for evaluation of Spasms   I reviewed [facility] ED visit from February where she was evaluated for similar pain.  Morbidly obese patient with history of DVT on Xarelto.   Patient presents for evaluation of right-sided neck pain and stiffness.  Atraumatic without any falls or injuries.  No weakness to the right arm.  Reports compliance with her anticoagulation.   Physical Exam   Triage Vital Signs: ED Triage Vitals  Enc Vitals Group     BP 04/06/23 0358 127/84     Pulse Rate 04/06/23 0358 (!) 117     Resp 04/06/23 0358 (!) 21     Temp 04/06/23 0358 97.8 F (36.6 C)     Temp Source 04/06/23 0358 Oral     SpO2 04/06/23 0358 95 %     Weight 04/06/23 0355 (!) 318 lb (144.2 kg)     Height 04/06/23 0355 5\' 7"  (1.702 m)     Head Circumference --      Peak Flow --      Pain Score 04/06/23 0355 10     Pain Loc --      Pain Edu? --      Excl. in [place]? --     Most recent vital signs: Vitals:   04/06/23 0358  BP: 127/84  Pulse: (!) 117  Resp: (!) 21  Temp: 97.8 F (36.6 C)  SpO2: 95%    General: Awake, no distress.  CV:  Good peripheral perfusion.  Resp:  Normal effort.  Abd:  No distention.  MSK:  No deformity noted.  Neuro:  No focal deficits appreciated. Other:  Palpable muscular cord and tenderness to the right-sided trapezius muscle, right-sided paraspinal cervical musculature.  No overlying skin changes or signs of trauma.  No midline spinal tenderness throughout.   ED Results / Procedures / Treatments   Labs (all labs ordered are listed, but only abnormal results are displayed) Labs Reviewed - No data to display  EKG   RADIOLOGY   Official radiology report(s): No results found.  PROCEDURES and  INTERVENTIONS:  Procedures  Trigger point injection After discussing risks versus benefits, patient is agreeable to proceed with trigger point injection for treatment of acute muscular spasm. Risks discussed include worsening pain, infection, neurovascular damage and pneumothorax.  Point of maximal tenderness was identified at right sided paraspinal cervical and thoracic musculature, right trapezius Overlying skin was cleaned with alcohol swabs. 1% lidocaine without epinephrine was incrementally injected, after confirming negative drawback, into this musculature. Total of injected. Well-tolerated without any apparent complications.   Medications - No data to display   IMPRESSION / MDM / ASSESSMENT AND PLAN / ED COURSE  I reviewed the triage vital signs and the nursing notes.  Differential diagnosis includes, but is not limited to, cervical fracture, wrist location, cellulitis, herpes zoster, trauma, muscle spasm, torticollis  {Patient presents with symptoms of an acute illness or injury that is potentially life-threatening.  Patient is with a couple days of atraumatic right-sided neck and shoulder discomfort and evidence of torticollis resolving with trigger point junction and suitable for outpatient management.  Reassuring exam without evidence of trauma, skin changes or neurologic deficits.  Resolving symptoms with trigger point injection, as  above.  Suitable for outpatient management.  Clinical Course as of 04/06/23 0631  Sat Apr 06, 2023  0425 Trigger point junction performed.  Well-tolerated.  10 cc of plain lidocaine at 3 locations.  Right-sided trapezius, right-sided paraspinal cervical and right-sided paraspinal thoracic [DS]  0455 Reassessed, feeling better. Appreciative  [DS]  [phone] Reassessed, feeling well.  Discussed management at home [DS]    Clinical Course User Index [DS] Delton Prairie, MD     FINAL CLINICAL IMPRESSION(S) / ED DIAGNOSES   Final  diagnoses:  None     Rx / DC Orders   ED Discharge Orders     None        Note:  This document was prepared using Dragon voice recognition software and may include unintentional dictation errors.   Delton Prairie, MD 04/06/23 [phone]

## 2023-04-06 NOTE — ED Notes (Signed)
Patient discharged at this time. Wheeled to lobby per pt request. Breathing unlabored speaking in full sentences. Verbalized understanding of all discharge, follow up, and medication teaching. Discharged homed with all belongings.   

## 2023-04-06 NOTE — ED Notes (Signed)
Md notified of patient's pain. No new orders

## 2023-04-08 ENCOUNTER — Other Ambulatory Visit: Payer: Self-pay

## 2023-04-08 ENCOUNTER — Emergency Department
Admission: EM | Admit: 2023-04-08 | Discharge: 2023-04-08 | Disposition: A | Discharge status: Home / Self Care | Attending: Emergency Medicine | Admitting: Emergency Medicine

## 2023-04-08 ENCOUNTER — Emergency Department

## 2023-04-08 DIAGNOSIS — M542 Cervicalgia: Secondary | ICD-10-CM

## 2023-04-08 DIAGNOSIS — I10 Essential (primary) hypertension: Secondary | ICD-10-CM | POA: Insufficient documentation

## 2023-04-08 DIAGNOSIS — Z7901 Long term (current) use of anticoagulants: Secondary | ICD-10-CM | POA: Diagnosis not present

## 2023-04-08 DIAGNOSIS — M5412 Radiculopathy, cervical region: Secondary | ICD-10-CM

## 2023-04-08 DIAGNOSIS — M4802 Spinal stenosis, cervical region: Secondary | ICD-10-CM | POA: Insufficient documentation

## 2023-04-08 MED ORDER — OXYCODONE-ACETAMINOPHEN 5-325 MG PO TABS
1.0000 | ORAL_TABLET | Freq: Once | ORAL | Status: AC
  Administered 2023-04-08: 1 via ORAL
  Filled 2023-04-08: qty 1

## 2023-04-08 MED ORDER — LORAZEPAM 1 MG PO TABS
1.0000 mg | ORAL_TABLET | Freq: Once | ORAL | Status: AC
  Administered 2023-04-08: 1 mg via ORAL
  Filled 2023-04-08: qty 1

## 2023-04-08 NOTE — ED Notes (Signed)
Pt sitting with family member over in the lobby. Pt can be heard loudly screaming from the triage area "this is bullshit!". Pt moved from triage middle to lobby due to patient yelling and cursing in triage area, on the way to the lobby from triage area, pt stating to staff that it was ridiculous she had to wait when it was "[our] fuckup to begin with 2 days ago". Security made aware, pt's violence risk score re-assessed at this time.

## 2023-04-08 NOTE — ED Notes (Signed)
Pt transported to MRI 

## 2023-04-08 NOTE — Discharge Instructions (Signed)
You were seen in the emergency department for significant pain of your neck.  You had an MRI done that showed significant cervical stenosis with nerve impingement.  Is importantly follow-up closely with your primary care physician, you may need a referral to a spine surgeon.  Discussed with your primary care physician possible referral for pain management.  You are on high doses of tramadol and this does not seem to be helping with your pain, discussed with your primary care physician if you should continue on high doses of tramadol.

## 2023-04-08 NOTE — ED Triage Notes (Addendum)
Pt to ed from home via [facility] for neck pain. Pt has chronic pain. Pt was just seen 7/6 for same. Pt has muscle injections for her chronic pain. HX of MS. Pt is crying in triage. Pt has taken her PO meds at home with no relief. Pt is caox4, crying in triage and in wheel chair. Pt received some sort of injection in our ER and feels worse since receiving that injection.  Site of injection is not red or hot to the touch. Per EMS vitals WNL 144/84 HR 112  Pt HTN in triage but unable to sit still for good assessment,.

## 2023-04-08 NOTE — ED Provider Notes (Addendum)
[facility] Provider Note    Event Date/Time   First MD Initiated Contact with Patient 04/08/23 0402     (approximate)   History   Neck Pain (chronic)   HPI  Mary Lutz is a 57 y.o. female past medical history significant for prior DVT and PE on Xarelto, hypertension, chronic pain syndrome, obesity, who presents to the emergency department with right-sided neck pain.  Patient states that she has been having ongoing neck pain for the past 1 to 2 weeks.  Evaluated in primary care physician office, urgent care and in the emergency department.  Sharp stabbing pain to the right side of her neck.  Denies any falls or trauma.  Denies any extremity numbness or weakness.  Taking a large amount of tramadol and muscle relaxer without significant improvement.  States that she has been taking Tylenol as well.  Denies any prior MRI of her neck.  Denies chest pain or shortness of breath.     Physical Exam   Triage Vital Signs: ED Triage Vitals  Enc Vitals Group     BP 04/08/23 0212 (S) (!) 190/120     Pulse Rate 04/08/23 0212 (!) 105     Resp 04/08/23 0212 (!) 22     Temp 04/08/23 0212 98 F (36.7 C)     Temp Source 04/08/23 0212 Oral     SpO2 04/08/23 0212 98 %     Weight 04/08/23 0209 (!) 319 lb 10.7 oz (145 kg)     Height 04/08/23 0209 5\' 7"  (1.702 m)     Head Circumference --      Peak Flow --      Pain Score 04/08/23 0209 10     Pain Loc --      Pain Edu? --      Excl. in GC? --     Most recent vital signs: Vitals:   04/08/23 0212 04/08/23 0451  BP: (S) (!) 190/120 (!) 170/90  Pulse: (!) 105 (!) 101  Resp: (!) 22 20  Temp: 98 F (36.7 C) 98 F (36.7 C)  SpO2: 98% 100%    Physical Exam Constitutional:      Appearance: She is well-developed.  HENT:     Head: Atraumatic.  Eyes:     Conjunctiva/sclera: Conjunctivae normal.  Neck:     Comments: No midline cervical spine tenderness, right-sided paraspinal muscle tenderness to  palpation. Cardiovascular:     Rate and Rhythm: Regular rhythm.  Pulmonary:     Effort: No respiratory distress.  Abdominal:     General: There is no distension.  Musculoskeletal:     Cervical back: Normal range of motion. Tenderness present.  Skin:    General: Skin is warm.  Neurological:     Mental Status: She is alert. Mental status is at baseline.     Comments: 5/5 strength bilateral upper extremities.  +2 pulses that are equal.  Sensation intact.  Cranial nerves intact.      IMPRESSION / MDM / ASSESSMENT AND PLAN / ED COURSE  I reviewed the triage vital signs and the nursing notes.  Differential diagnosis including muscle spasm, cervical radiculopathy, fracture, bowel, abscess  Patient has been evaluated multiple times for this neck pain.  Patient had been seen multiple months ago at [facility] and was given injections at that time.  Large dose of tramadol, unable to take NSAIDs secondary to her Xarelto use.  Also endorses Lidoderm patches.  On my evaluation patient in significant  pain.  Normal neurologic exam.  Given ongoing symptoms and has failed outpatient management we will obtain an MRI for further evaluation.  Given p.o. Percocet.  Will give Ativan for claustrophobia with MRI.   RADIOLOGY I independently reviewed imaging, my interpretation of imaging: MRI cervical spine without   Labs (all labs ordered are listed, but only abnormal results are displayed) Labs interpreted as -    Labs Reviewed - No data to display  MRI with generalized cervical spine degeneration with moderate spine stenosis of C3/C4 with disc protrusion.  Foraminal impingement bilaterally C3/C4 and C6/7, left C4/C5  Discussed the findings with the patient.  Discussed close follow-up with her primary care physician and possible need for referral to spine specialist and pain clinic.     PROCEDURES:  Critical Care performed: No  Procedures  Patient's presentation is most consistent with  exacerbation of chronic illness.   MEDICATIONS ORDERED IN ED: Medications  oxyCODONE-acetaminophen (PERCOCET/ROXICET) 5-325 MG per tablet 1 tablet (has no administration in time range)  oxyCODONE-acetaminophen (PERCOCET/ROXICET) 5-325 MG per tablet 1 tablet (1 tablet Oral Given 04/08/23 0450)  LORazepam (ATIVAN) tablet 1 mg (1 mg Oral Given 04/08/23 0456)    FINAL CLINICAL IMPRESSION(S) / ED DIAGNOSES   Final diagnoses:  Neck pain  Cervical radiculopathy  Cervical stenosis of spinal canal     Rx / DC Orders   ED Discharge Orders     None        Note:  This document was prepared using Dragon voice recognition software and may include unintentional dictation errors.   Corena Herter, MD 04/08/23 4098    Corena Herter, MD 04/08/23 [phone]

## 2023-05-12 ENCOUNTER — Other Ambulatory Visit: Payer: Self-pay

## 2023-05-12 ENCOUNTER — Emergency Department: Payer: 59

## 2023-05-12 ENCOUNTER — Emergency Department
Admission: EM | Admit: 2023-05-12 | Discharge: 2023-05-12 | Disposition: A | Discharge status: Home / Self Care | Payer: 59 | Attending: Emergency Medicine | Admitting: Emergency Medicine

## 2023-05-12 DIAGNOSIS — D72829 Elevated white blood cell count, unspecified: Secondary | ICD-10-CM | POA: Insufficient documentation

## 2023-05-12 DIAGNOSIS — Z7901 Long term (current) use of anticoagulants: Secondary | ICD-10-CM | POA: Insufficient documentation

## 2023-05-12 DIAGNOSIS — I509 Heart failure, unspecified: Secondary | ICD-10-CM | POA: Insufficient documentation

## 2023-05-12 DIAGNOSIS — I110 Hypertensive heart disease with heart failure: Secondary | ICD-10-CM | POA: Diagnosis not present

## 2023-05-12 DIAGNOSIS — J449 Chronic obstructive pulmonary disease, unspecified: Secondary | ICD-10-CM | POA: Diagnosis not present

## 2023-05-12 DIAGNOSIS — R4182 Altered mental status, unspecified: Secondary | ICD-10-CM | POA: Diagnosis present

## 2023-05-12 DIAGNOSIS — R404 Transient alteration of awareness: Secondary | ICD-10-CM | POA: Diagnosis not present

## 2023-05-12 DIAGNOSIS — R0789 Other chest pain: Secondary | ICD-10-CM | POA: Diagnosis not present

## 2023-05-12 LAB — COMPREHENSIVE METABOLIC PANEL
ALT: 14 U/L (ref 0–44)
AST: 14 U/L — ABNORMAL LOW (ref 15–41)
Albumin: 4.2 g/dL (ref 3.5–5.0)
Alkaline Phosphatase: 68 U/L (ref 38–126)
Anion gap: 11 (ref 5–15)
BUN: 14 mg/dL (ref 6–20)
CO2: 22 mmol/L (ref 22–32)
Calcium: 9.2 mg/dL (ref 8.9–10.3)
Chloride: 105 mmol/L (ref 98–111)
Creatinine, Ser: 0.99 mg/dL (ref 0.44–1.00)
GFR, Estimated: >60 mL/min (ref >60)
Glucose, Bld: 88 mg/dL (ref 70–99)
Potassium: 4.1 mmol/L (ref 3.5–5.1)
Sodium: 138 mmol/L (ref 135–145)
Total Bilirubin: 1 mg/dL (ref 0.3–1.2)
Total Protein: 7.8 g/dL (ref 6.5–8.1)

## 2023-05-12 LAB — D-DIMER, QUANTITATIVE: D-Dimer, Quant: <0.27 ug/mL-FEU (ref 0.00–0.50)

## 2023-05-12 LAB — URINALYSIS, ROUTINE W REFLEX MICROSCOPIC
Bacteria, UA: NONE SEEN
Bilirubin Urine: NEGATIVE
Glucose, UA: NEGATIVE mg/dL
Ketones, ur: NEGATIVE mg/dL
Leukocytes,Ua: NEGATIVE
Nitrite: NEGATIVE
Protein, ur: NEGATIVE mg/dL
Specific Gravity, Urine: 1.008 (ref 1.005–1.030)
pH: 5 (ref 5.0–8.0)

## 2023-05-12 LAB — URINE DRUG SCREEN, QUALITATIVE (ARMC ONLY)
Amphetamines, Ur Screen: NOT DETECTED
Barbiturates, Ur Screen: NOT DETECTED
Benzodiazepine, Ur Scrn: POSITIVE — AB
Cannabinoid 50 Ng, Ur ~~LOC~~: NOT DETECTED
Cocaine Metabolite,Ur ~~LOC~~: NOT DETECTED
MDMA (Ecstasy)Ur Screen: NOT DETECTED
Methadone Scn, Ur: NOT DETECTED
Opiate, Ur Screen: POSITIVE — AB
Phencyclidine (PCP) Ur S: NOT DETECTED
Tricyclic, Ur Screen: NOT DETECTED

## 2023-05-12 LAB — DIFFERENTIAL
Abs Immature Granulocytes: 0.04 10*3/uL (ref 0.00–0.07)
Basophils Absolute: 0.1 10*3/uL (ref 0.0–0.1)
Basophils Relative: 1 %
Eosinophils Absolute: 0.3 10*3/uL (ref 0.0–0.5)
Eosinophils Relative: 2 %
Immature Granulocytes: 0 %
Lymphocytes Relative: 22 %
Lymphs Abs: 3.5 10*3/uL (ref 0.7–4.0)
Monocytes Absolute: 1.3 10*3/uL — ABNORMAL HIGH (ref 0.1–1.0)
Monocytes Relative: 8 %
Neutro Abs: 10.6 10*3/uL — ABNORMAL HIGH (ref 1.7–7.7)
Neutrophils Relative %: 67 %

## 2023-05-12 LAB — PROTIME-INR
INR: 1.5 — ABNORMAL HIGH (ref 0.8–1.2)
Prothrombin Time: 18.7 seconds — ABNORMAL HIGH (ref 11.4–15.2)

## 2023-05-12 LAB — CBC
HCT: 47.8 % — ABNORMAL HIGH (ref 36.0–46.0)
Hemoglobin: 15.5 g/dL — ABNORMAL HIGH (ref 12.0–15.0)
MCH: 31.4 pg (ref 26.0–34.0)
MCHC: 32.4 g/dL (ref 30.0–36.0)
MCV: 97 fL (ref 80.0–100.0)
Platelets: 384 10*3/uL (ref 150–400)
RBC: 4.93 MIL/uL (ref 3.87–5.11)
RDW: 14.4 % (ref 11.5–15.5)
WBC: 15.7 10*3/uL — ABNORMAL HIGH (ref 4.0–10.5)
nRBC: 0 % (ref 0.0–0.2)

## 2023-05-12 LAB — CBG MONITORING, ED: Glucose-Capillary: 78 mg/dL (ref 70–99)

## 2023-05-12 LAB — TROPONIN I (HIGH SENSITIVITY)
Troponin I (High Sensitivity): 5 ng/L (ref <18)
Troponin I (High Sensitivity): 4 ng/L (ref <18)

## 2023-05-12 LAB — ETHANOL: Alcohol, Ethyl (B): <10 mg/dL (ref <10)

## 2023-05-12 LAB — APTT: aPTT: 33 seconds (ref 24–36)

## 2023-05-12 MED ORDER — LORAZEPAM 1 MG PO TABS
1.0000 mg | ORAL_TABLET | Freq: Once | ORAL | Status: DC | PRN
  Filled 2023-05-12: qty 1

## 2023-05-12 MED ORDER — LORAZEPAM 2 MG/ML IJ SOLN
1.0000 mg | Freq: Once | INTRAMUSCULAR | Status: AC
  Administered 2023-05-12: 1 mg via INTRAVENOUS
  Filled 2023-05-12: qty 1

## 2023-05-12 NOTE — ED Notes (Signed)
Pt notified that MD said that I could get her ready to go and the paperwork would be done by then. Secondary shortly came in and reviewed paperwork.

## 2023-05-12 NOTE — Consult Note (Signed)
CODE STROKE- PHARMACY COMMUNICATION   Time CODE STROKE called/page received: 1531  Time response to CODE STROKE was made: 1540  Time Stroke Kit retrieved from [facility]xis (only if needed): N/A on Xarelto PTA  Name of Provider/Nurse contacted: Milon Dikes, MD   Past Medical History:  Diagnosis Date   CHF (congestive heart failure) (HCC)    COPD (chronic obstructive pulmonary disease) (HCC)    DVT (deep venous thrombosis) (HCC) 2010   Hypertension    MI (myocardial infarction) (HCC)    x3; admissions here and [facility]. Patient reports valvular heart disease; [facility] records reference CAD as well   Prior to Admission medications   Medication Sig Start Date End Date Taking? Authorizing Provider  buPROPion (WELLBUTRIN XL) 150 MG 24 hr tablet Take 150 mg by mouth daily. 01/22/23  Yes [provider]  carisoprodol (SOMA) 350 MG tablet Take 350 mg by mouth daily. 04/02/23  Yes [provider]  diazepam (VALIUM) 5 MG tablet Take 5 mg by mouth daily as needed. 04/09/23  Yes [provider]  naloxone (NARCAN) nasal spray 4 mg/0.1 mL Place into the nose. 04/09/23  Yes [provider]  nortriptyline (PAMELOR) 10 MG capsule Take by mouth. 11/30/22  Yes [provider]  ondansetron (ZOFRAN) 4 MG tablet Take by mouth. 05/09/23  Yes [provider]  oxyCODONE-acetaminophen (PERCOCET/ROXICET) 5-325 MG tablet Take by mouth. 04/09/23  Yes [provider]  predniSONE (DELTASONE) 20 MG tablet Take 20 mg by mouth 2 (two) times daily. 04/02/23  Yes [provider]  rosuvastatin (CRESTOR) 5 MG tablet Take 5 mg by mouth daily. 05/08/23  Yes [provider]  topiramate (TOPAMAX) 25 MG tablet Take 25 mg by mouth daily. 11/07/22  Yes [provider]  XARELTO 20 MG TABS tablet  12/05/22  Yes [provider]  acetaminophen (TYLENOL) 325 MG tablet Take 2 tablets (650 mg total) by mouth every 6 (six) hours as needed for mild pain (or Fever >/=  101). 03/19/19   Gouru, Deanna Artis, MD  albuterol (VENTOLIN HFA) 108 (90 Base) MCG/ACT inhaler Inhale 2 puffs into the lungs every 4 (four) hours as needed for wheezing or shortness of breath. 12/18/18   [provider]  ANORO ELLIPTA 62.5-25 MCG/INH AEPB Inhale 1 puff into the lungs daily. 12/22/18   [provider]  clobetasol ointment (TEMOVATE) 0.05 % Apply 1 application topically 2 (two) times daily. 11/14/20   [provider]  diclofenac Sodium (VOLTAREN) 1 % GEL Apply 2 g topically 4 (four) times daily. 10/29/19   Sharman Cheek, MD  doxycycline (VIBRA-TABS) 100 MG tablet Take 1 tablet (100 mg total) by mouth 2 (two) times daily. 05/17/21   Edwin Cap, DPM  enoxaparin (LOVENOX) 150 MG/ML injection  03/22/20   [provider]  ergocalciferol (VITAMIN D2) 1.25 MG (50000 UT) capsule Take by mouth. 11/05/19   [provider]  Fluticasone-Umeclidin-Vilant Harrel Carina ELLIPTA) 100-62.5-25 MCG/INH AEPB  06/30/20   [provider]  HYDROcodone-acetaminophen (NORCO/VICODIN) 5-325 MG tablet Take 1 tablet by mouth every 6 (six) hours as needed for moderate pain. 06/15/22   Fisher, Roselyn Bering, PA-C  ipratropium-albuterol (DUONEB) 0.5-2.5 (3) MG/3ML SOLN Inhale 3 mLs into the lungs 4 (four) times daily as needed for wheezing or cough. 10/08/18   [provider]  lidocaine (LIDODERM) 5 % Place 1 patch onto the skin every 12 (twelve) hours. Remove & Discard patch within 12 hours or as directed by MD 10/29/19   Sharman Cheek, MD  lidocaine (LIDODERM) 5 % Place 1 patch onto the skin every 12 (twelve) hours. Remove & Discard patch within 12 hours or as directed by MD 04/06/23 04/05/24  Delton Prairie, MD  Liraglutide -Weight Management (SAXENDA) 18 MG/3ML SOPN Inject into the skin. 04/04/21   [provider]  lisinopril (ZESTRIL) 10 MG tablet Take 10 mg by mouth at bedtime.  12/22/18   [provider]  lisinopril (ZESTRIL) 20 MG tablet Take 20 mg by  mouth daily. 01/22/21   [provider]  medroxyPROGESTERone (PROVERA) 10 MG tablet Take 10 mg by mouth at bedtime.  02/05/18   [provider]  medroxyPROGESTERone (PROVERA) 10 MG tablet Take 1 tablet by mouth daily. 03/27/21   [provider]  methocarbamol (ROBAXIN) 750 MG tablet  01/27/21   [provider]  methylPREDNISolone (MEDROL DOSEPAK) 4 MG TBPK tablet Take 6 pills on day one then decrease by 1 pill each day 06/15/22   Faythe Ghee, PA-C  mupirocin ointment (BACTROBAN) 2 % Apply 1 application topically 2 (two) times daily. 05/08/21   McDonald, Rachelle Hora, DPM  nicotine (NICODERM CQ - DOSED IN MG/24 HOURS) 14 mg/24hr patch Place 1 patch (14 mg total) onto the skin daily. 03/20/19   Gouru, Deanna Artis, MD  sertraline (ZOLOFT) 100 MG tablet Take 100 mg by mouth daily. 12/04/18   [provider]  sertraline (ZOLOFT) 50 MG tablet  02/23/20   [provider]  tiZANidine (ZANAFLEX) 4 MG tablet  02/23/20   [provider]  traMADol (ULTRAM) 50 MG tablet Take 50 mg by mouth daily as needed. 01/27/21   [provider]  traMADol Janean Sark) 50 MG tablet  01/27/21   [provider]  warfarin (COUMADIN) 5 MG tablet Take 5-7.5 mg by mouth at bedtime. Take 7.5mg  on Friday. Take 5mg  on all other days.    [provider]  ZEPBOUND 7.5 MG/0.5ML Pen Inject 7.5 mg into the skin once a week.    [provider]   Celene Squibb, PharmD Clinical Pharmacist 05/12/2023 3:51 PM

## 2023-05-12 NOTE — ED Notes (Signed)
Patient ambulated to bedside commode with assistance.  ?

## 2023-05-12 NOTE — ED Triage Notes (Signed)
Pt to ED via [facility] for c/o mid-upper chest pain, slurred speech, and confusion. Pt states "head is buzzy." Symptoms began at 1420. Patient is oriented to self, disoriented to time and place.  Pt has hx of breast cancer on right side, hx MI at 28.  Cbg 87

## 2023-05-12 NOTE — Progress Notes (Signed)
MRI negative. Symptoms better. No further inpatient neurological work up at this time. Relayed to Dr. Rosalia Hammers    -- Milon Dikes, MD Neurologist [facility] Pager: [phone]

## 2023-05-12 NOTE — Consult Note (Signed)
Neurology Consultation  Reason for Consult: Code stroke-speech difficulty, left-sided weakness Referring Physician: Dr. Trinna Post  CC: Speech difficulty, left-sided weakness  History is obtained from: Chart, patient  HPI: Mary Lutz is a 57 y.o. female past medical history of CAD, CHF, COPD, DVT on anticoagulation with Xarelto, hypertension, chronic pain on opiates, tobacco abuse, presenting to the emergency department for evaluation of sudden onset of mid upper chest pain, slurred speech, confusion.  She reported initially at triage that her head feels like it is buzzing.  Last known well 2:20 PM.  Also noted to have left arm and leg weakness.  Due to focal neurological symptoms and headache and last known well being in the treatment window code stroke was activated. Initially she was oriented to self but disoriented to time and place per triage note.  Headache is all over the head, pounding.  Reports photophobia and sonophobia. She has a history of right-sided breast cancer as well as MI at a young age. CBG was 85. Blood pressure was in the 120s to 130s systolic. She was examined in the CT scanner. See detailed exam below.  Reports taking her opiates as prescribed.  Some changes were made to her opiates with dosage increased recently but she has not taken any extra doses.  LKW: 2:20 PM IV thrombolysis given?: no, on Xarelto EVT: Exam not consistent with LVO Premorbid modified Rankin scale (mRS): 1  ROS: Full ROS was performed and is negative except as noted in the HPI.   Past Medical History:  Diagnosis Date   CHF (congestive heart failure) (HCC)    COPD (chronic obstructive pulmonary disease) (HCC)    DVT (deep venous thrombosis) (HCC) 2010   Hypertension    MI (myocardial infarction) (HCC)    x3; admissions here and [facility]. Patient reports valvular heart disease; [facility] records reference CAD as well   Family History  Problem Relation Age of Onset   Diabetes Mellitus II  Mother    Diabetes Mellitus II Father    Heart failure Father    Valvular heart disease Father    Hypertension Sister    Social History:   reports that she has been smoking cigarettes. She has never used smokeless tobacco. She reports that she does not drink alcohol and does not use drugs.  Medications No current facility-administered medications for this encounter.  Current Outpatient Medications:    buPROPion (WELLBUTRIN XL) 150 MG 24 hr tablet, Take 150 mg by mouth daily., Disp: , Rfl:    carisoprodol (SOMA) 350 MG tablet, Take 350 mg by mouth daily., Disp: , Rfl:    diazepam (VALIUM) 5 MG tablet, Take 5 mg by mouth daily as needed., Disp: , Rfl:    naloxone (NARCAN) nasal spray 4 mg/0.1 mL, Place into the nose., Disp: , Rfl:    nortriptyline (PAMELOR) 10 MG capsule, Take by mouth., Disp: , Rfl:    ondansetron (ZOFRAN) 4 MG tablet, Take by mouth., Disp: , Rfl:    oxyCODONE-acetaminophen (PERCOCET/ROXICET) 5-325 MG tablet, Take by mouth., Disp: , Rfl:    predniSONE (DELTASONE) 20 MG tablet, Take 20 mg by mouth 2 (two) times daily., Disp: , Rfl:    rosuvastatin (CRESTOR) 5 MG tablet, Take 5 mg by mouth daily., Disp: , Rfl:    topiramate (TOPAMAX) 25 MG tablet, Take 25 mg by mouth daily., Disp: , Rfl:    XARELTO 20 MG TABS tablet, , Disp: , Rfl:    acetaminophen (TYLENOL) 325 MG tablet, Take 2 tablets (  650 mg total) by mouth every 6 (six) hours as needed for mild pain (or Fever >/= 101)., Disp:  , Rfl:    albuterol (VENTOLIN HFA) 108 (90 Base) MCG/ACT inhaler, Inhale 2 puffs into the lungs every 4 (four) hours as needed for wheezing or shortness of breath., Disp: , Rfl:    ANORO ELLIPTA 62.5-25 MCG/INH AEPB, Inhale 1 puff into the lungs daily., Disp: , Rfl:    clobetasol ointment (TEMOVATE) 0.05 %, Apply 1 application topically 2 (two) times daily., Disp: , Rfl:    diclofenac Sodium (VOLTAREN) 1 % GEL, Apply 2 g topically 4 (four) times daily., Disp: 100 g, Rfl: 0   doxycycline  (VIBRA-TABS) 100 MG tablet, Take 1 tablet (100 mg total) by mouth 2 (two) times daily., Disp: 28 tablet, Rfl: 0   enoxaparin (LOVENOX) 150 MG/ML injection, , Disp: , Rfl:    ergocalciferol (VITAMIN D2) 1.25 MG (50000 UT) capsule, Take by mouth., Disp: , Rfl:    Fluticasone-Umeclidin-Vilant (TRELEGY ELLIPTA) 100-62.5-25 MCG/INH AEPB, , Disp: , Rfl:    HYDROcodone-acetaminophen (NORCO/VICODIN) 5-325 MG tablet, Take 1 tablet by mouth every 6 (six) hours as needed for moderate pain., Disp: 15 tablet, Rfl: 0   ipratropium-albuterol (DUONEB) 0.5-2.5 (3) MG/3ML SOLN, Inhale 3 mLs into the lungs 4 (four) times daily as needed for wheezing or cough., Disp: , Rfl:    lidocaine (LIDODERM) 5 %, Place 1 patch onto the skin every 12 (twelve) hours. Remove & Discard patch within 12 hours or as directed by MD, Disp: 15 patch, Rfl: 0   lidocaine (LIDODERM) 5 %, Place 1 patch onto the skin every 12 (twelve) hours. Remove & Discard patch within 12 hours or as directed by MD, Disp: 10 patch, Rfl: 0   Liraglutide -Weight Management (SAXENDA) 18 MG/3ML SOPN, Inject into the skin., Disp: , Rfl:    lisinopril (ZESTRIL) 10 MG tablet, Take 10 mg by mouth at bedtime. , Disp: , Rfl:    lisinopril (ZESTRIL) 20 MG tablet, Take 20 mg by mouth daily., Disp: , Rfl:    medroxyPROGESTERone (PROVERA) 10 MG tablet, Take 10 mg by mouth at bedtime. , Disp: , Rfl: 11   medroxyPROGESTERone (PROVERA) 10 MG tablet, Take 1 tablet by mouth daily., Disp: , Rfl:    methocarbamol (ROBAXIN) 750 MG tablet, , Disp: , Rfl:    methylPREDNISolone (MEDROL DOSEPAK) 4 MG TBPK tablet, Take 6 pills on day one then decrease by 1 pill each day, Disp: 21 tablet, Rfl: 0   mupirocin ointment (BACTROBAN) 2 %, Apply 1 application topically 2 (two) times daily., Disp: 30 g, Rfl: 2   nicotine (NICODERM CQ - DOSED IN MG/24 HOURS) 14 mg/24hr patch, Place 1 patch (14 mg total) onto the skin daily., Disp: 28 patch, Rfl: 0   sertraline (ZOLOFT) 100 MG tablet, Take 100 mg  by mouth daily., Disp: , Rfl:    sertraline (ZOLOFT) 50 MG tablet, , Disp: , Rfl:    tiZANidine (ZANAFLEX) 4 MG tablet, , Disp: , Rfl:    traMADol (ULTRAM) 50 MG tablet, Take 50 mg by mouth daily as needed., Disp: , Rfl:    traMADol (ULTRAM) 50 MG tablet, , Disp: , Rfl:    warfarin (COUMADIN) 5 MG tablet, Take 5-7.5 mg by mouth at bedtime. Take 7.5mg  on Friday. Take 5mg  on all other days., Disp: , Rfl: 3   ZEPBOUND 7.5 MG/0.5ML Pen, Inject 7.5 mg into the skin once a week., Disp: , Rfl:   Exam: Current vital  signs: BP 129/78 (BP Location: Left Arm)   Temp 97.8 F (36.6 C) (Oral)   Resp 13   Ht 5\' 7"  (1.702 m)   Wt (!) 146.4 kg   BMI 50.55 kg/m  Vital signs in last 24 hours: Temp:  [97.8 F (36.6 C)] 97.8 F (36.6 C) (08/11 1514) Resp:  [13] 13 (08/11 1514) BP: (129)/(78) 129/78 (08/11 1514) Weight:  [146.4 kg] 146.4 kg (08/11 1517) General: She is awake alert and somewhat of her distress due to generalized discomfort all over her body. HNT: Normocephalic/atraumatic Lungs: Breathing well saturating normally on room air CVS: Regular rate rhythm Abdomen: Obese, nontender Extremities: Warm well-perfused.  Tender to palpate neck and left upper and lower extremity. Neurological exam She is awake alert in distress due to generalized discomfort including headache. Speech is mildly dysarthric She also have mild aphasia-has difficulty forming long sentences.  It almost appears volitional. Cranial nerves: Pupils equal round react light, extraocular movements intact, visual fields full, face appears grossly symmetric, facial sensation is diminished on the left face in comparison to the right including splitting vibratory sense on the forehead. Motor examination with intact right-sided strength.  Left side has diminished strength in the upper and lower extremity with pain that radiates from the arm to the neck.  The left-sided weakness also has some effort dependent component to it.  Equivocal  Hoover sign. Sensation: Diminished on the left hemibody with a sharp cut off in the midline.  No extinction.  No neglect Coordination examination intact on the right.  Difficult to perform on the left. NIHSS-7  Labs I have reviewed labs in epic and the results pertinent to this consultation are:  CBC    Component Value Date/Time   WBC 15.7 (H) 05/12/2023 1532   RBC 4.93 05/12/2023 1532   HGB 15.5 (H) 05/12/2023 1532   HCT 47.8 (H) 05/12/2023 1532   PLT 384 05/12/2023 1532   MCV 97.0 05/12/2023 1532   MCH 31.4 05/12/2023 1532   MCHC 32.4 05/12/2023 1532   RDW 14.4 05/12/2023 1532   LYMPHSABS 3.5 05/12/2023 1532   MONOABS 1.3 (H) 05/12/2023 1532   EOSABS 0.3 05/12/2023 1532   BASOSABS 0.1 05/12/2023 1532    CMP     Component Value Date/Time   NA 140 06/23/2022 1657   K 3.5 06/23/2022 1657   CL 108 06/23/2022 1657   CO2 23 06/23/2022 1657   GLUCOSE 92 06/23/2022 1657   BUN 15 06/23/2022 1657   CREATININE 0.69 06/23/2022 1657   CALCIUM 8.8 (L) 06/23/2022 1657   PROT 7.5 06/23/2022 1657   ALBUMIN 3.8 06/23/2022 1657   AST 18 06/23/2022 1657   ALT 11 06/23/2022 1657   ALKPHOS 69 06/23/2022 1657   BILITOT 0.7 06/23/2022 1657   GFRNONAA >60 06/23/2022 1657   GFRAA >60 03/17/2019 2317   Imaging I have reviewed the images obtained: CT-head-no acute changes.  Aspects 10.  Assessment:  Mary Lutz is a 57 year old woman who has a past medical history of prior DVT and PE on Xarelto, hypertension, CAD, CHF, COPD, chronic pain on opiates, tobacco abuse who presented to the emergency department for evaluation of sudden onset of mid upper chest pain slurred speech, confusion and then reportedly had left-sided numbness.  Initially reported a headache that she felt like her head was buzzing.  Then reports a headache with pounding and photophobia and phonophobia and also noted to have left-sided weakness on exam. Her speech was also slurred with some stuttering. Speech  and  left-sided weakness had functional components on exam. CT head was unremarkable. Not a candidate for IV TNKase due to being on Xarelto. Clinical exam does not look suspicious for large vessel occlusion hence vessel imaging was not emergently performed. That said, she does have risk factors for strokes and I would recommend getting an MRI and treating her headache and pain symptomatically.  Impression: Headache and focal left-sided weakness-evaluate for stroke versus complex migraine. Exam not consistent with LVO  Recommendations: Symptomatic headache management per primary team MRI of the brain without contrast. - If MRI of the brain is positive for stroke, will need admission for stroke workup that would include CTA head and neck, 2D echocardiogram, telemetry, frequent neurochecks PT OT evaluations, A1c and lipid panel. -If the MRI is negative for stroke, do not see a need for any inpatient workup further. Please call back after the MRI is done and I will update recommendations based on her clinical course and imaging findings. And was discussed with Dr. Rosalia Hammers -- Milon Dikes, MD Neurologist [facility] Pager: [phone]

## 2023-05-12 NOTE — ED Provider Notes (Signed)
[facility] Provider Note    Event Date/Time   First MD Initiated Contact with Patient 05/12/23 1506     (approximate)   History   Chest Pain and Altered Mental Status   HPI  Daneli L Mcneese is a 57 y.o. female with history of DVT/PE on Xarelto, hypertension, chronic pain presenting to the emergency department for evaluation of chest pain and altered mental status.  Around 220 today, patient was blowing balloons getting ready for a birthday party for her grandson.  During this time she began to develop some mild pain in her upper chest.  However, family noticed that she became pale and began to develop slurred speech and confusion.  No history of similar.  On presentation, patient reports that her left leg also feels heavy.  Activated as a code stroke.      Physical Exam   Triage Vital Signs: ED Triage Vitals  Encounter Vitals Group     BP 05/12/23 1514 129/78     Systolic BP Percentile --      Diastolic BP Percentile --      Pulse --      Resp 05/12/23 1514 13     Temp 05/12/23 1514 97.8 F (36.6 C)     Temp Source 05/12/23 1514 Oral     SpO2 --      Weight 05/12/23 1517 (!) 322 lb 12.1 oz (146.4 kg)     Height 05/12/23 1517 5\' 7"  (1.702 m)     Head Circumference --      Peak Flow --      Pain Score 05/12/23 1517 3     Pain Loc --      Pain Education --      Exclude from Growth Chart --     Most recent vital signs: Vitals:   05/12/23 1715 05/12/23 1730  BP: (!) 149/93 (!) 158/98  Pulse: 91 90  Resp: (!) 23 17  Temp:    SpO2: 100% 97%     General: Awake, interactive, appears distressed CV:  Regular rate, good peripheral perfusion.  Resp:  Lungs clear, unlabored respirations.  Abd:  Soft, nondistended, nontender Neuro:  Keenly aware, reports the month is October, her age is 66 which is incorrect, able to follow basic commands, normal extraocular movements, no appreciable field cut, no gross facial asymmetry, able to hold bilateral  upper extremities antigravity, significant up-and-down movement during testing, unable to hold left lower extremity antigravity for 5 seconds, speech is intermittently dysarthric, does appear to have some word finding difficulties, no inattention  ED Results / Procedures / Treatments   Labs (all labs ordered are listed, but only abnormal results are displayed) Labs Reviewed  PROTIME-INR - Abnormal; Notable for the following components:      Result Value   Prothrombin Time 18.7 (*)    INR 1.5 (*)    All other components within normal limits  CBC - Abnormal; Notable for the following components:   WBC 15.7 (*)    Hemoglobin 15.5 (*)    HCT 47.8 (*)    All other components within normal limits  DIFFERENTIAL - Abnormal; Notable for the following components:   Neutro Abs 10.6 (*)    Monocytes Absolute 1.3 (*)    All other components within normal limits  COMPREHENSIVE METABOLIC PANEL - Abnormal; Notable for the following components:   AST 14 (*)    All other components within normal limits  URINE DRUG SCREEN, QUALITATIVE ([facility] ONLY) -  Abnormal; Notable for the following components:   Opiate, Ur Screen POSITIVE (*)    Benzodiazepine, Ur Scrn POSITIVE (*)    All other components within normal limits  URINALYSIS, ROUTINE W REFLEX MICROSCOPIC - Abnormal; Notable for the following components:   Color, Urine YELLOW (*)    APPearance CLEAR (*)    Hgb urine dipstick SMALL (*)    All other components within normal limits  ETHANOL  APTT  D-DIMER, QUANTITATIVE  CBG MONITORING, ED  TROPONIN I (HIGH SENSITIVITY)  TROPONIN I (HIGH SENSITIVITY)     EKG EKG independently reviewed interpreted by myself (ER attending) demonstrates:  EKG demonstrates sinus rhythm at a rate of 94, PR 149, QRS 89, QTc 437, no acute ST changes  RADIOLOGY Imaging independently reviewed and interpreted by myself demonstrates:  CT head without acute bleed MRI brain without acute stroke  PROCEDURES:  Critical  Care performed: No  Procedures   MEDICATIONS ORDERED IN ED: Medications  LORazepam (ATIVAN) injection 1 mg (1 mg Intravenous Given 05/12/23 1619)     IMPRESSION / MDM / ASSESSMENT AND PLAN / ED COURSE  I reviewed the triage vital signs and the nursing notes.  Differential diagnosis includes, but is not limited to, acute CVA, intracranial bleed, anemia, electrolyte abnormality, ACS, PE, consideration for aortic dissection, but overall low suspicion given mild chest pain that is now resolved  Patient's presentation is most consistent with acute presentation with potential threat to life or bodily function.  57 year old female presenting with acute onset altered mental status and mild chest pain.  Given her acute change in mental status with deficits on exam, code stroke was activated.  Case was reviewed with Dr. Wilford Corner.  He did recommend an MRI brain in the ER.  If positive, recommended admission, but if negative did feel that patient could be discharged from a neurology perspective.  MRI was reassuring.  Her labs demonstrated a leukocytosis, not new for the patient, normal D-dimer, negative troponin x 2.  Urine without evidence of infection.  On reevaluation, patient reports feeling much improved.  She is no longer dysarthric and on her repeat neurologic exam has only mild drift of her left lower extremity that does not hit the bed.  She is eager to be discharged home.  Given reassuring workup both from a neurology and cardiology perspective, do think this is reasonable.  Discussed the importance of close outpatient follow-up.  Patient discharged in stable condition.      FINAL CLINICAL IMPRESSION(S) / ED DIAGNOSES   Final diagnoses:  Transient alteration of awareness  Atypical chest pain     Rx / DC Orders   ED Discharge Orders     None        Note:  This document was prepared using Dragon voice recognition software and may include unintentional dictation errors.   Trinna Post,  MD 05/12/23 1910

## 2023-05-12 NOTE — ED Notes (Signed)
Code stroke called to [facility] at 3:31

## 2023-05-12 NOTE — Progress Notes (Signed)
   05/12/23 1519  Spiritual Encounters  Type of Visit Initial  Care provided to: Family;Pt not available  Referral source Code page  Reason for visit Code  OnCall Visit Yes  Advance Directives (For Healthcare)  Does Patient Have a Medical Advance Directive? No  Mental Health Advance Directives  Does Patient Have a Mental Health Advance Directive? No   Chaplain responded to Code stroke. Patient at CT. Family requested prayer.

## 2023-05-12 NOTE — Discharge Instructions (Signed)
You were seen in the emergency department today for evaluation of your chest pain and your weakness and difficulty speaking.  I am glad you are feeling better now.  Your testing was reassuring including an MRI that did not show evidence of a stroke.  Please follow with your primary care doctor within a few days for reevaluation.  Return to the ER for new or worsening symptoms.

## 2023-05-12 NOTE — ED Notes (Addendum)
Pt on call light and stated she was just told by the MD that she was discharged. RN explained MD is working on d/c paperwork right now and then I would d/c her. Pt very irritable and states she doesn't want to wait and wants to leave now. Pt states she has been here long enough and nothing is wrong with her but she is hungry. Food offered for while she wait, but declined. MD notified.
